# Patient Record
Sex: Male | Born: 1945 | Race: Black or African American | Hispanic: No | Marital: Single | Smoking: Former smoker
Health system: Southern US, Community
[De-identification: ages and names within clinical notes are randomized; demographics above are authoritative.]

## PROBLEM LIST (undated history)

## (undated) DIAGNOSIS — I10 Essential (primary) hypertension: Secondary | ICD-10-CM

## (undated) HISTORY — PX: BACK SURGERY: SHX140

## (undated) HISTORY — PX: APPENDECTOMY: SHX54

---

## 2020-08-29 ENCOUNTER — Other Ambulatory Visit: Payer: Self-pay

## 2020-08-29 ENCOUNTER — Observation Stay
Admission: EM | Admit: 2020-08-29 | Discharge: 2020-08-30 | Disposition: A | Discharge status: Home / Self Care | Payer: Medicare HMO | Attending: Internal Medicine | Admitting: Internal Medicine

## 2020-08-29 ENCOUNTER — Emergency Department: Payer: Medicare HMO

## 2020-08-29 ENCOUNTER — Observation Stay: Payer: Medicare HMO

## 2020-08-29 ENCOUNTER — Encounter: Payer: Self-pay | Admitting: Emergency Medicine

## 2020-08-29 DIAGNOSIS — I639 Cerebral infarction, unspecified: Secondary | ICD-10-CM | POA: Diagnosis not present

## 2020-08-29 DIAGNOSIS — Z20822 Contact with and (suspected) exposure to covid-19: Secondary | ICD-10-CM | POA: Diagnosis not present

## 2020-08-29 DIAGNOSIS — R29898 Other symptoms and signs involving the musculoskeletal system: Secondary | ICD-10-CM | POA: Insufficient documentation

## 2020-08-29 DIAGNOSIS — Z79899 Other long term (current) drug therapy: Secondary | ICD-10-CM | POA: Insufficient documentation

## 2020-08-29 DIAGNOSIS — Z87891 Personal history of nicotine dependence: Secondary | ICD-10-CM | POA: Diagnosis not present

## 2020-08-29 DIAGNOSIS — I10 Essential (primary) hypertension: Secondary | ICD-10-CM | POA: Diagnosis present

## 2020-08-29 DIAGNOSIS — R200 Anesthesia of skin: Secondary | ICD-10-CM | POA: Diagnosis present

## 2020-08-29 HISTORY — DX: Essential (primary) hypertension: I10

## 2020-08-29 LAB — COMPREHENSIVE METABOLIC PANEL
ALT: 18 U/L (ref 0–44)
AST: 22 U/L (ref 15–41)
Albumin: 4.5 g/dL (ref 3.5–5.0)
Alkaline Phosphatase: 69 U/L (ref 38–126)
Anion gap: 8 (ref 5–15)
BUN: 17 mg/dL (ref 8–23)
CO2: 24 mmol/L (ref 22–32)
Calcium: 9.1 mg/dL (ref 8.9–10.3)
Chloride: 108 mmol/L (ref 98–111)
Creatinine, Ser: 1.13 mg/dL (ref 0.61–1.24)
GFR, Estimated: >60 mL/min (ref >60)
Glucose, Bld: 115 mg/dL — ABNORMAL HIGH (ref 70–99)
Potassium: 4 mmol/L (ref 3.5–5.1)
Sodium: 140 mmol/L (ref 135–145)
Total Bilirubin: 0.8 mg/dL (ref 0.3–1.2)
Total Protein: 7.7 g/dL (ref 6.5–8.1)

## 2020-08-29 LAB — CBC
HCT: 44.1 % (ref 39.0–52.0)
Hemoglobin: 15.4 g/dL (ref 13.0–17.0)
MCH: 34.5 pg — ABNORMAL HIGH (ref 26.0–34.0)
MCHC: 34.9 g/dL (ref 30.0–36.0)
MCV: 98.7 fL (ref 80.0–100.0)
Platelets: 179 10*3/uL (ref 150–400)
RBC: 4.47 MIL/uL (ref 4.22–5.81)
RDW: 12.4 % (ref 11.5–15.5)
WBC: 6.5 10*3/uL (ref 4.0–10.5)
nRBC: 0 % (ref 0.0–0.2)

## 2020-08-29 LAB — URINE DRUG SCREEN, QUALITATIVE (ARMC ONLY)
Amphetamines, Ur Screen: NOT DETECTED
Barbiturates, Ur Screen: NOT DETECTED
Benzodiazepine, Ur Scrn: NOT DETECTED
Cannabinoid 50 Ng, Ur ~~LOC~~: NOT DETECTED
Cocaine Metabolite,Ur ~~LOC~~: NOT DETECTED
MDMA (Ecstasy)Ur Screen: NOT DETECTED
Methadone Scn, Ur: NOT DETECTED
Opiate, Ur Screen: NOT DETECTED
Phencyclidine (PCP) Ur S: NOT DETECTED
Tricyclic, Ur Screen: NOT DETECTED

## 2020-08-29 LAB — SARS CORONAVIRUS 2 (TAT 6-24 HRS): SARS Coronavirus 2: NEGATIVE

## 2020-08-29 LAB — DIFFERENTIAL
Abs Immature Granulocytes: 0.01 10*3/uL (ref 0.00–0.07)
Basophils Absolute: 0.1 10*3/uL (ref 0.0–0.1)
Basophils Relative: 1 %
Eosinophils Absolute: 0.1 10*3/uL (ref 0.0–0.5)
Eosinophils Relative: 1 %
Immature Granulocytes: 0 %
Lymphocytes Relative: 28 %
Lymphs Abs: 1.9 10*3/uL (ref 0.7–4.0)
Monocytes Absolute: 0.5 10*3/uL (ref 0.1–1.0)
Monocytes Relative: 8 %
Neutro Abs: 4 10*3/uL (ref 1.7–7.7)
Neutrophils Relative %: 62 %

## 2020-08-29 LAB — PROTIME-INR
INR: 1 (ref 0.8–1.2)
Prothrombin Time: 12.9 seconds (ref 11.4–15.2)

## 2020-08-29 LAB — APTT: aPTT: 28 seconds (ref 24–36)

## 2020-08-29 IMAGING — US US CAROTID DUPLEX BILAT
1 series · 13 of 24 positions shown · non-contrast
Comparison: Corresponding MRI from [DATE].

CLINICAL DATA: Initial evaluation for acute stroke.

EXAM:
BILATERAL CAROTID DUPLEX ULTRASOUND
TECHNIQUE: Gray scale imaging, color Doppler and duplex ultrasound were
performed of bilateral carotid and vertebral arteries in the neck.

[Series 1: us carotid bilateral · 13 of 68 slices shown]
[im 1/68]
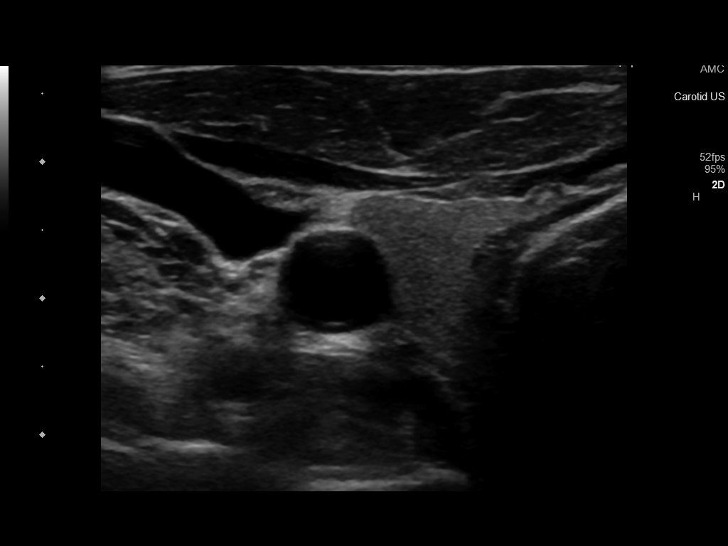
[im 6/68]
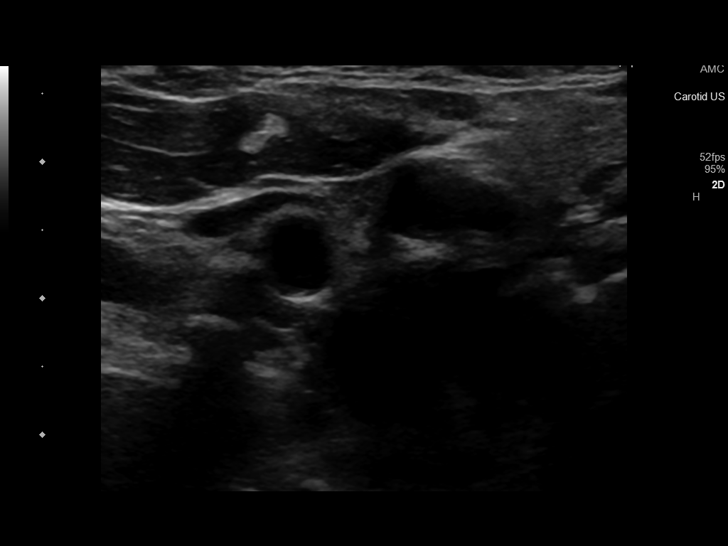
[im 12/68]
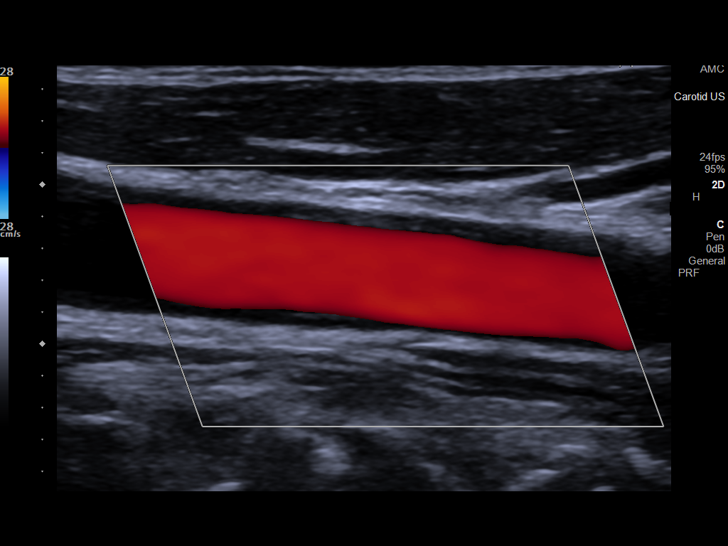
[im 18/68]
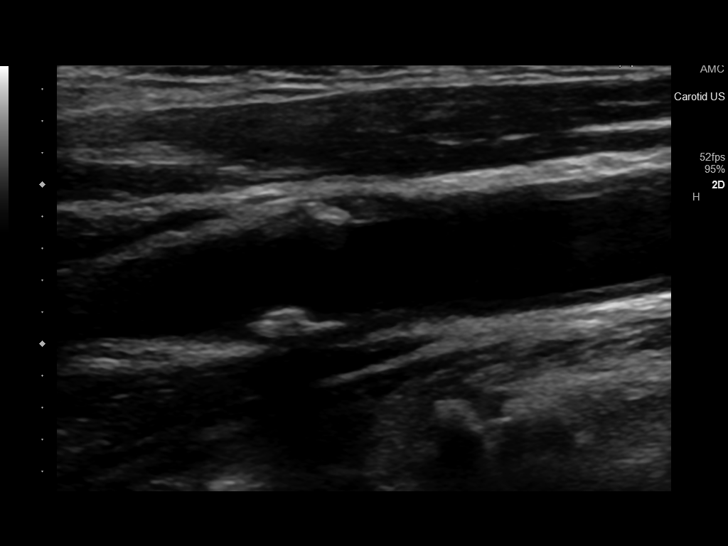
[im 24/68]
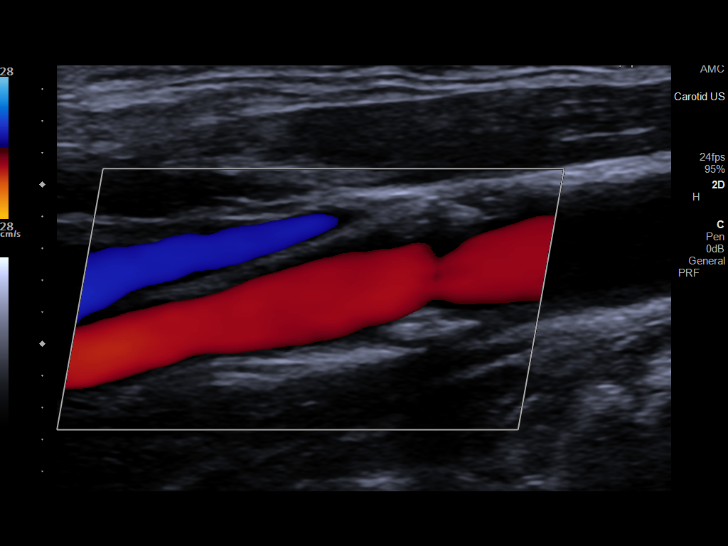
[im 30/68]
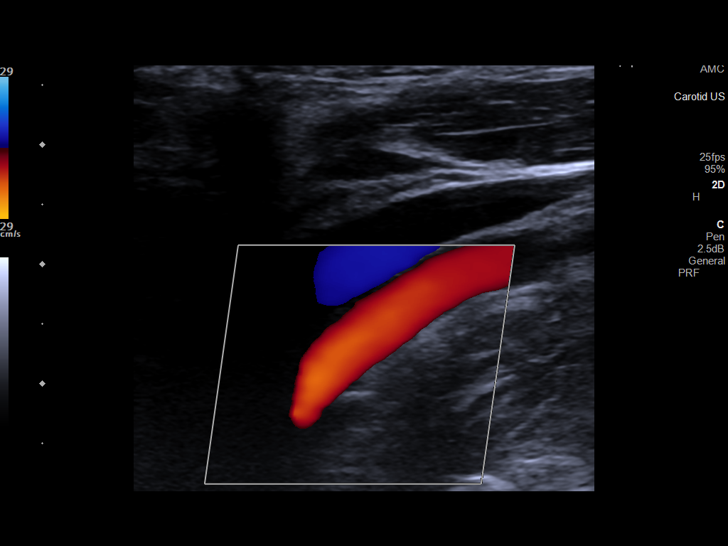
[im 35/68]
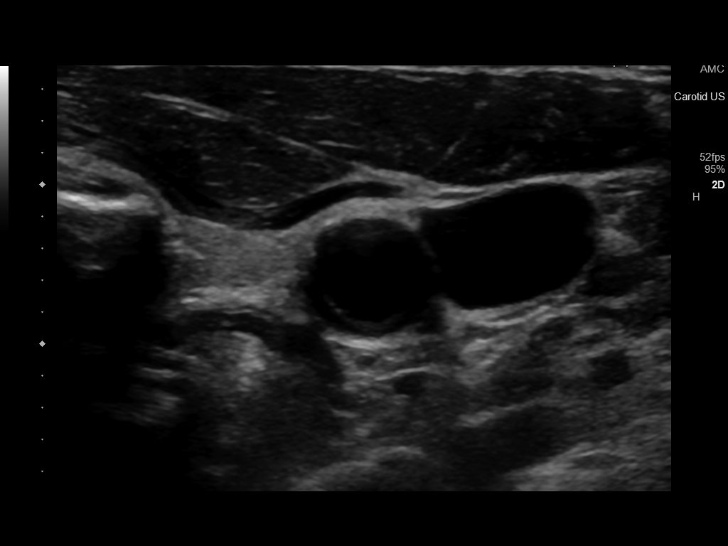
[im 38/68]
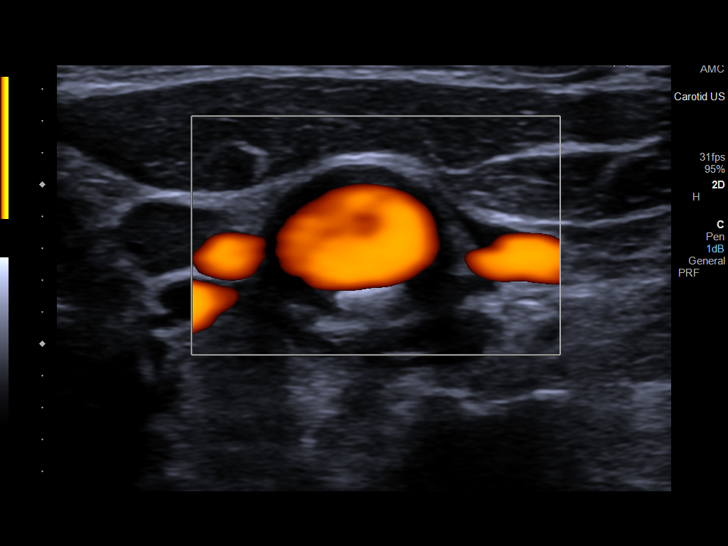
[im 44/68]
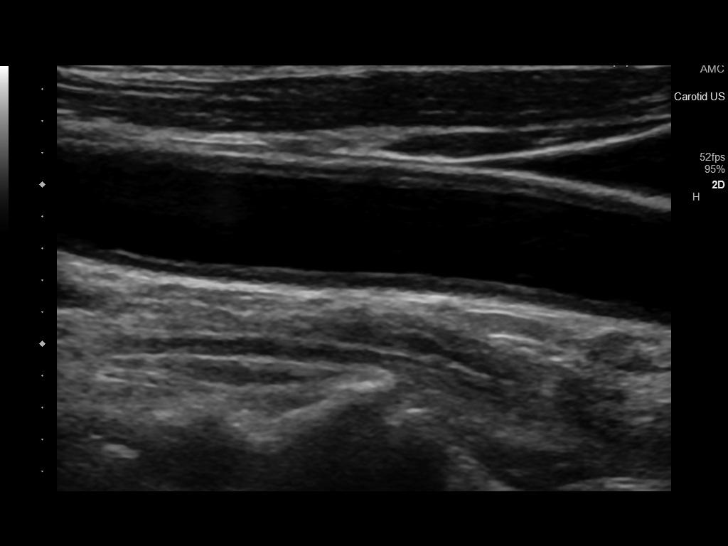
[im 50/68]
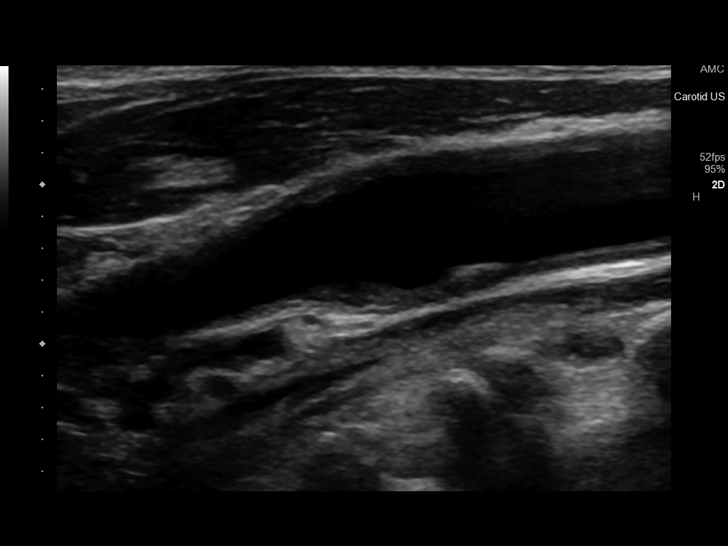
[im 56/68]
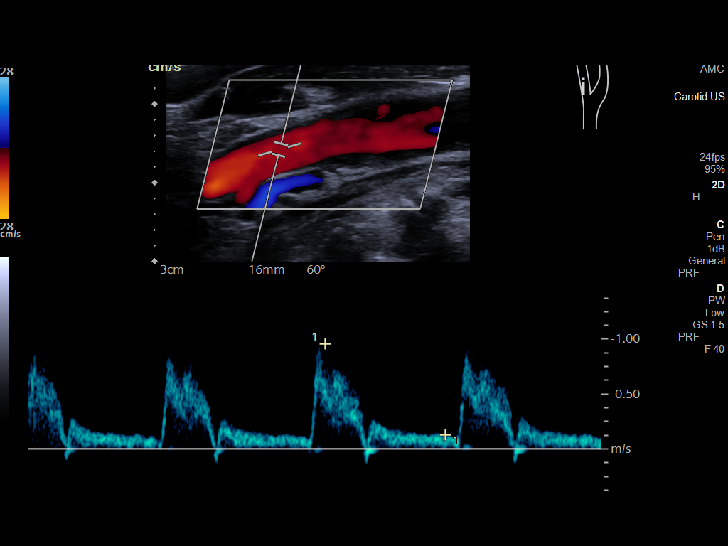
[im 62/68]
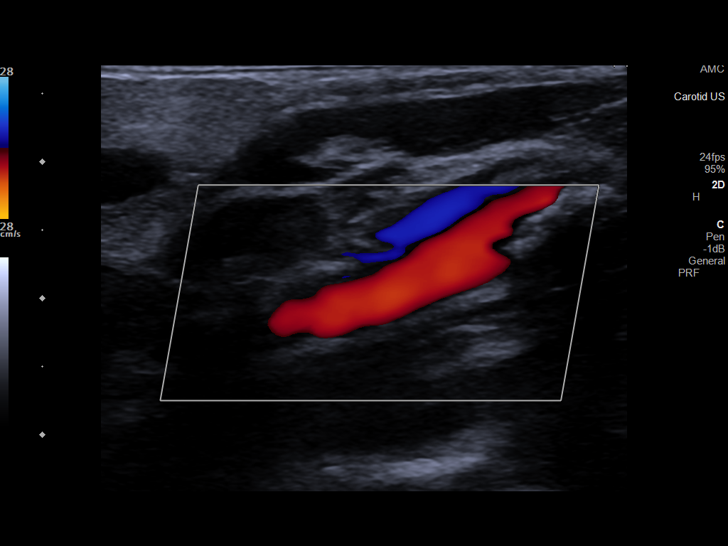
[im 68/68]
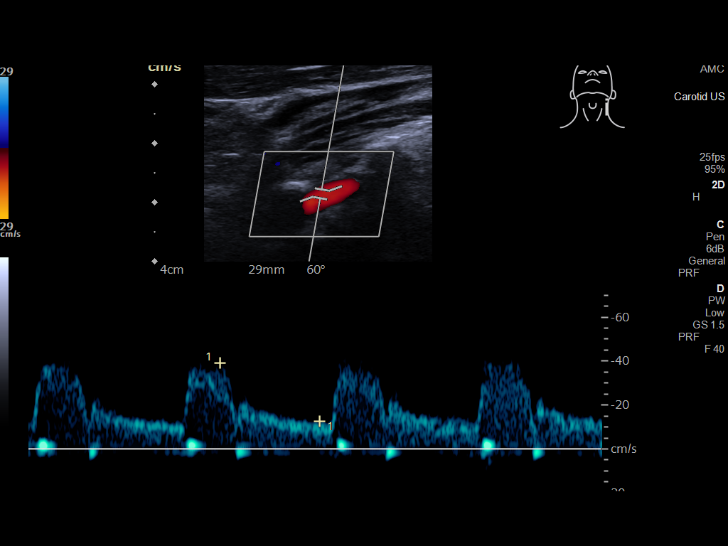

[13 of 24 positions shown; findings below may reference images not displayed]

FINDINGS: Criteria: Quantification of carotid stenosis is based on velocity
parameters that correlate the residual internal carotid diameter
with NASCET-based stenosis levels, using the diameter of the distal
internal carotid lumen as the denominator for stenosis measurement.

The following velocity measurements were obtained:

RIGHT

ICA: 102/32 cm/sec

CCA: 89/17 cm/sec

SYSTOLIC ICA/CCA RATIO:

ECA: 109 cm/sec

LEFT

ICA: 115/34 cm/sec

CCA: 94/23 cm/sec

SYSTOLIC ICA/CCA RATIO:

ECA: 96 cm/sec

RIGHT CAROTID ARTERY: Mild diffuse intimal thickening seen
throughout the visualized right CCA without significant stenosis.
Echogenic plaque with mild irregularity seen about the right carotid
bulb. No elevation in peak systolic velocity to suggest
hemodynamically significant stenosis. Right ICA widely patent
distally.

RIGHT VERTEBRAL ARTERY:  Antegrade.

LEFT CAROTID ARTERY: Mild diffuse intimal thickening within the left
CCA without stenosis. Irregular echogenic atheromatous plaque seen
about the left carotid bulb with extension into the proximal left
ICA, slightly more severe as compared to the contralateral right
side. No elevation in peak systolic velocity to suggest
hemodynamically significant stenosis. Left ICA widely patent
distally.

LEFT VERTEBRAL ARTERY:  Antegrade.
IMPRESSION: 1. Irregular echogenic plaque about the carotid bulb/proximal ICAs
bilaterally, left greater than right, but without hemodynamically
significant stenosis. Estimated stenosis 0-49% by ultrasound
criteria.
2. Patent antegrade flow within both vertebral arteries within the
neck.

## 2020-08-29 IMAGING — CT CT HEAD W/O CM
4 series · 16 of 47 positions shown, 18 images · non-contrast
Comparison: None

CLINICAL DATA: Awoke this morning with difficulty controlling RIGHT
arm, RIGHT side numbness, RIGHT side of face feels numb are funny,
history hypertension

EXAM:
CT HEAD WITHOUT CONTRAST
TECHNIQUE: Contiguous axial images were obtained from the base of the skull
through the vertex without intravenous contrast. Sagittal and
coronal MPR images reconstructed from axial data set.

[Series 2: head bone · axial · 0.39mm/px · z∈[+1018,+1046]mm · 3 of 73 slices shown]
[im 8/73  bone]
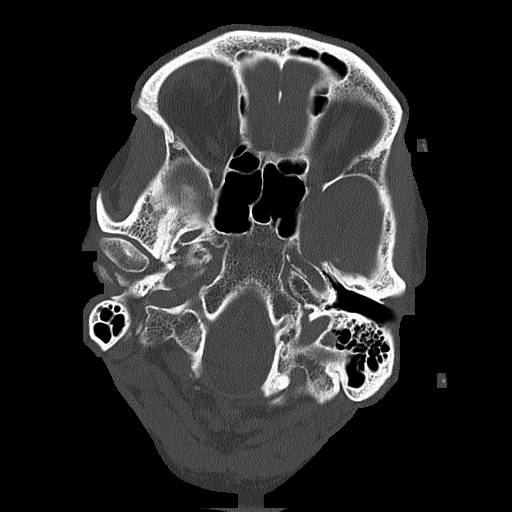
[im 15/73  bone]
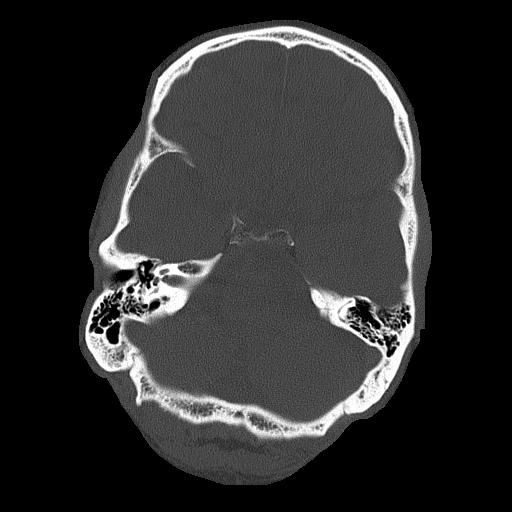
[im 22/73  bone]
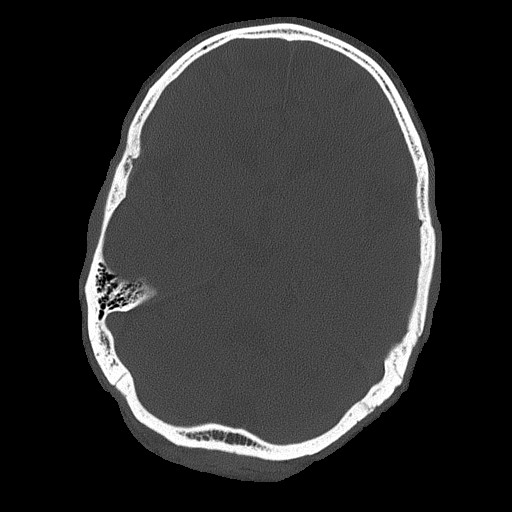

[Series 3: head wo · axial · 0.39mm/px · z∈[+1019,+1124]mm · 7 of 29 slices shown, 9 images]
[im 4/29  brain]
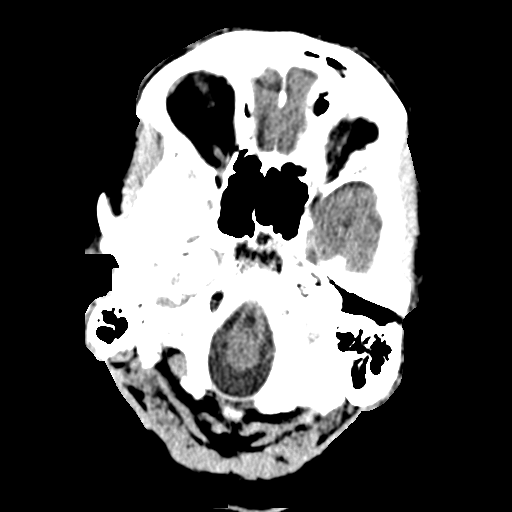
[im 4/29  bone]
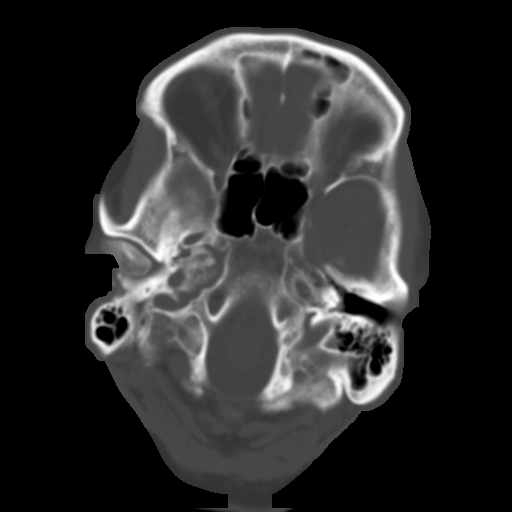
[im 8/29  brain]
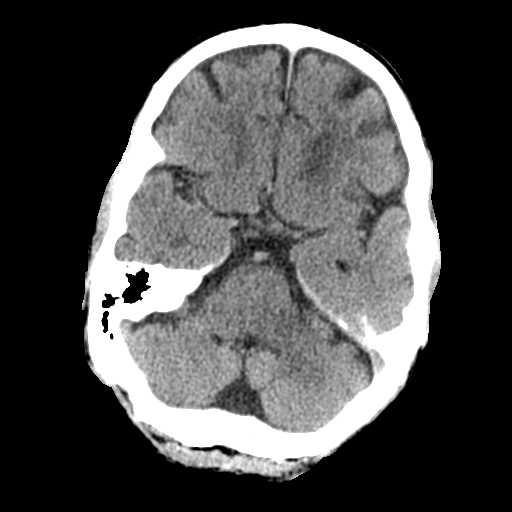
[im 11/29  brain]
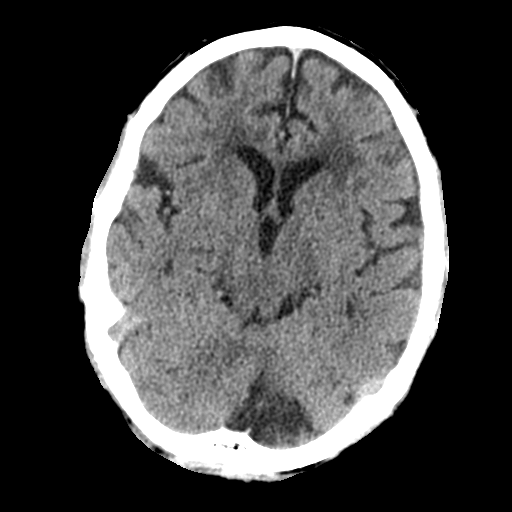
[im 15/29  brain]
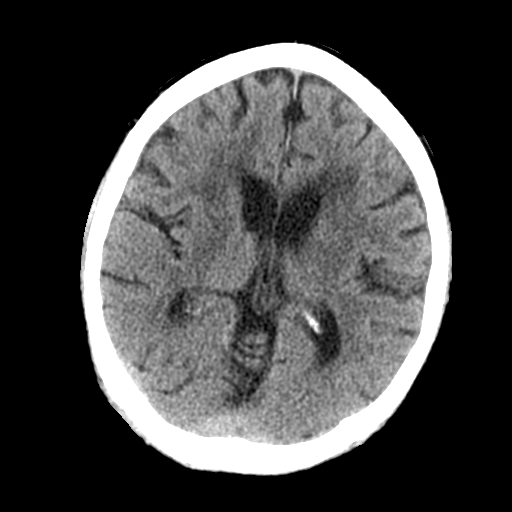
[im 18/29  brain]
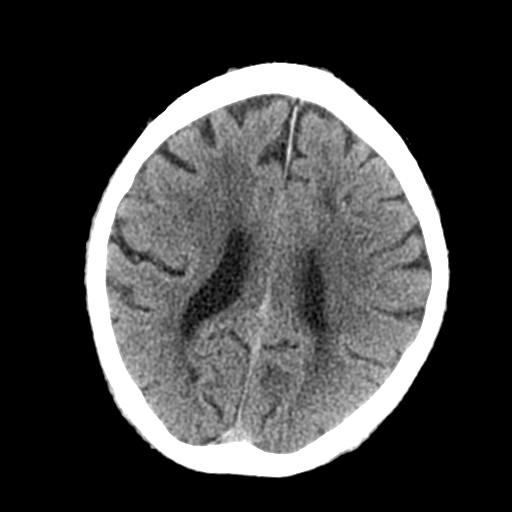
[im 18/29  bone]
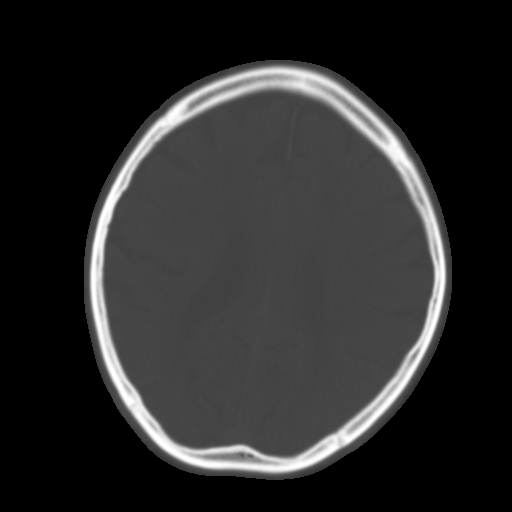
[im 22/29  brain]
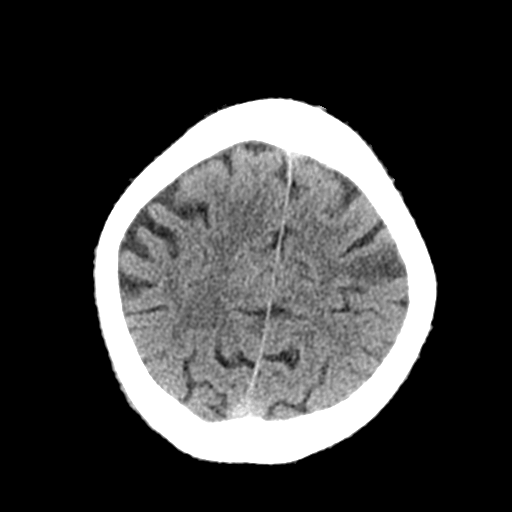
[im 25/29  brain]
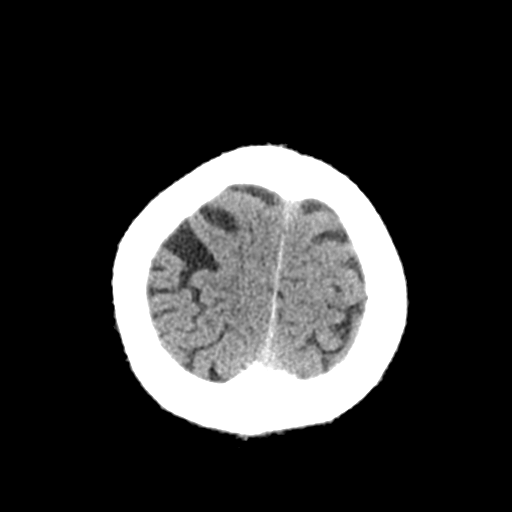

[Series 4: coronal soft tissue · coronal · 0.30mm/px · 3 of 65 slices shown]
[im 22/65  brain]
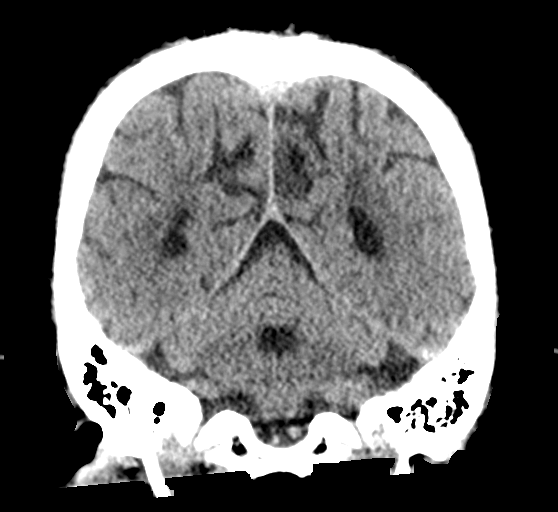
[im 29/65  brain]
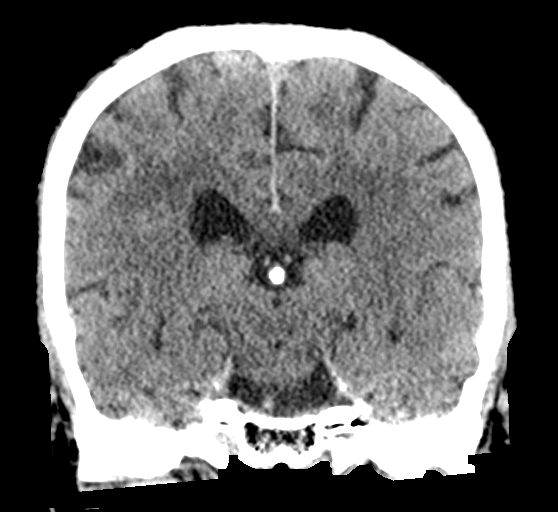
[im 36/65  brain]
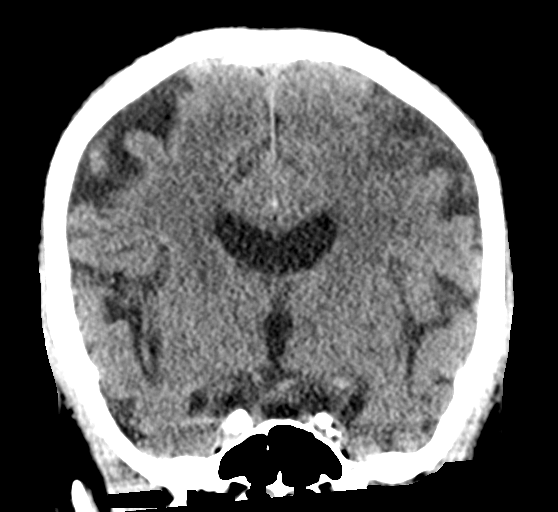

[Series 5: sagittal soft tissue · sagittal · 0.30mm/px · 3 of 56 slices shown]
[im 19/56  brain]
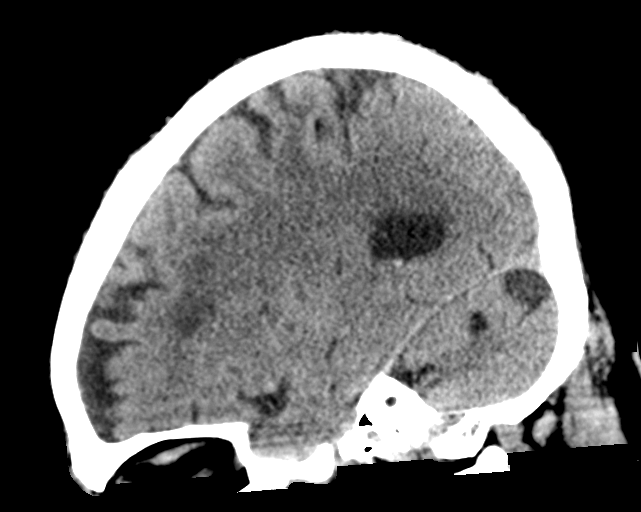
[im 28/56  brain]
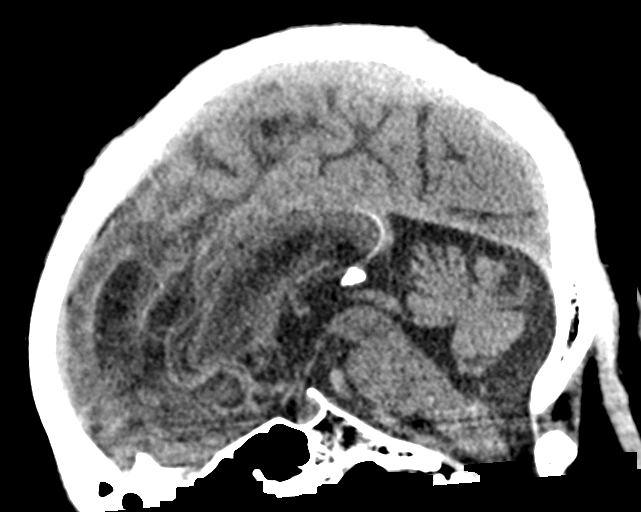
[im 37/56  brain]
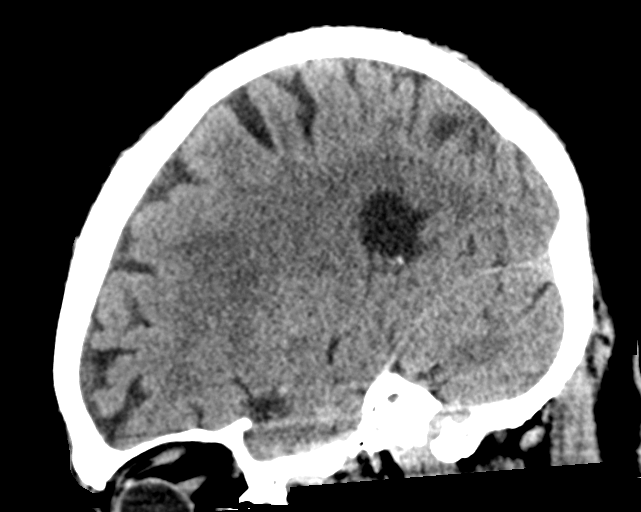

[16 of 47 positions shown; findings below may reference images not displayed]

FINDINGS: Brain: Generalized atrophy. Normal ventricular morphology. No
midline shift or mass effect. Small vessel chronic ischemic changes
of deep cerebral white matter. Old BILATERAL cerebellar infarcts,
larger on LEFT. Small age-indeterminate LEFT frontal lobe infarct.
No intracranial hemorrhage, mass lesion, or additional infarct
identified. No extra-axial fluid collections.

Vascular: No hyperdense vessels. Atherosclerotic calcification of
internal carotid arteries at skull base

Skull: Intact

Sinuses/Orbits: Clear

Other: N/A
IMPRESSION: Atrophy with small vessel chronic ischemic changes of deep cerebral
white matter.

Old BILATERAL cerebellar infarcts, larger on LEFT.

Small age-indeterminate LEFT frontal lobe infarct.

No additional intracranial abnormalities.

## 2020-08-29 IMAGING — MR MR HEAD W/O CM
11 series · 38 of 48 positions shown · non-contrast
Comparison: Prior CT from [DATE]

CLINICAL DATA: Initial evaluation for neuro deficit, stroke
suspected.

EXAM:
MRI HEAD WITHOUT CONTRAST
MRA HEAD WITHOUT CONTRAST
TECHNIQUE: Multiplanar, multiecho pulse sequences of the brain and surrounding
structures were obtained without intravenous contrast. Angiographic
images of the head were obtained using MRA technique without
contrast.

[Series 5: ax dwi_tracew · axial · 3.0mm · 0.65mm/px · z∈[-141,+2]mm · 4 of 48 slices shown]
[im 1/48]
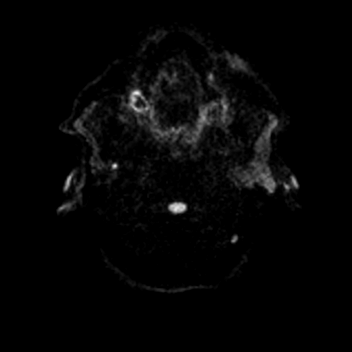
[im 16/48]
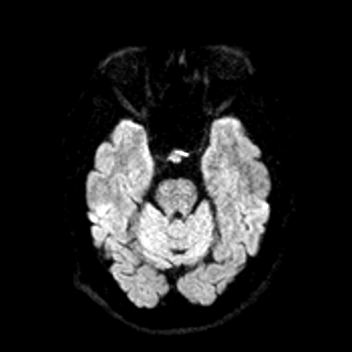
[im 32/48]
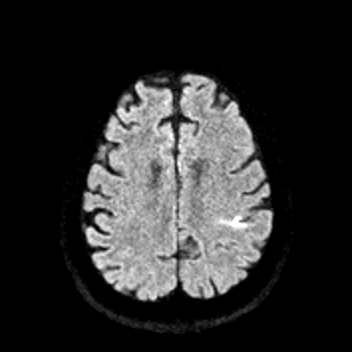
[im 48/48]
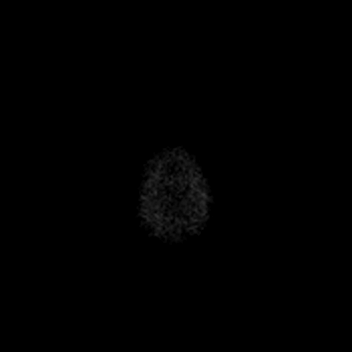

[Series 6: ax dwi_adc · axial · 3.0mm · 0.65mm/px · z∈[-141,+2]mm · 4 of 48 slices shown]
[im 1/48]
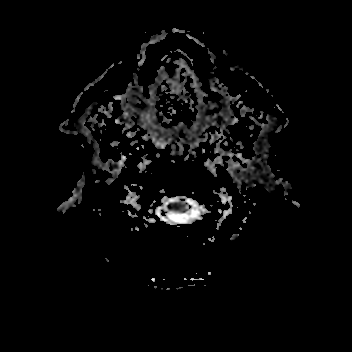
[im 16/48]
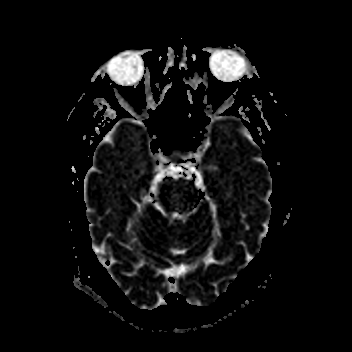
[im 32/48]
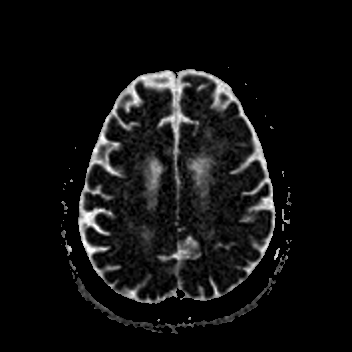
[im 48/48]
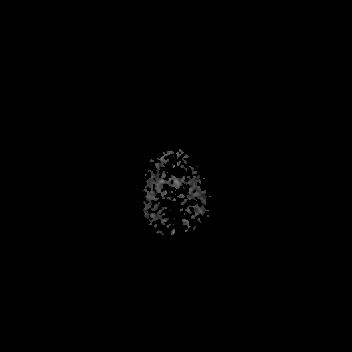

[Series 7: cor dwi_tracew · coronal · 5.0mm · 0.65mm/px · 3 of 40 slices shown]
[im 1/40]
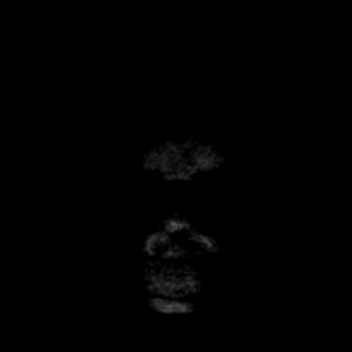
[im 20/40]
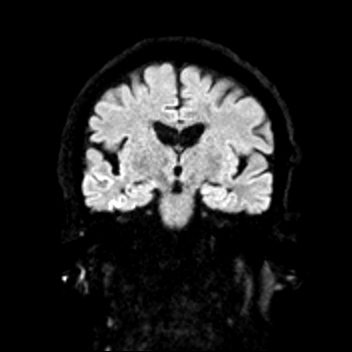
[im 40/40]
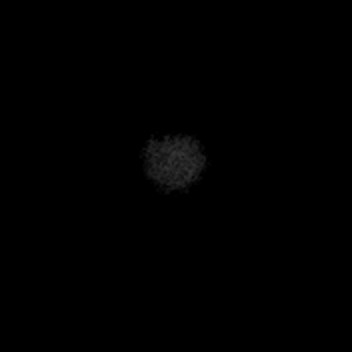

[Series 8: cor dwi_adc · coronal · 5.0mm · 0.65mm/px · 3 of 40 slices shown]
[im 1/40]
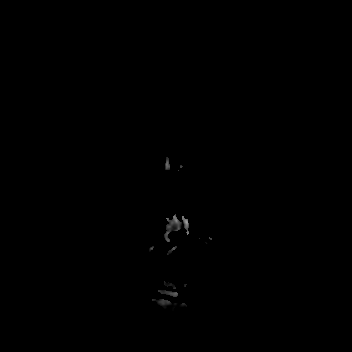
[im 20/40]
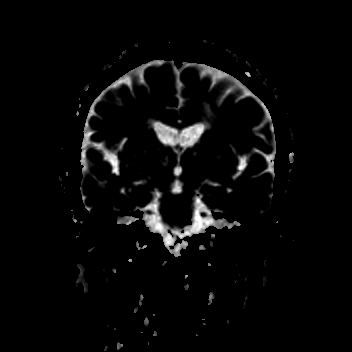
[im 40/40]
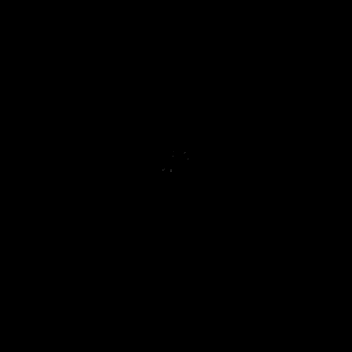

[Series 14: T1 · sagittal · 5.0mm · 0.62mm/px · 2 of 25 slices shown (1 of 2)]
[im 1/25]
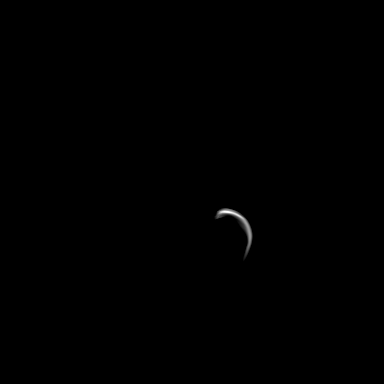
[im 25/25]
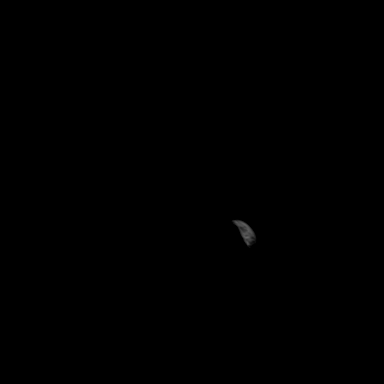

[Series 15: T2 · axial · 5.0mm · 0.53mm/px · z∈[-139,+4]mm · 2 of 27 slices shown (1 of 2)]
[im 1/27]
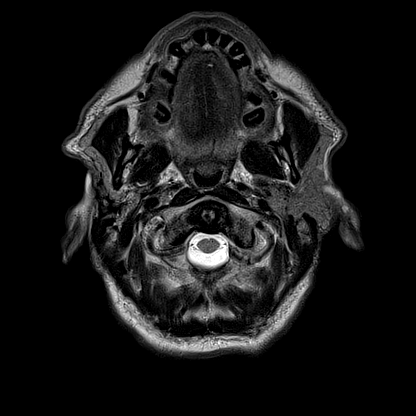
[im 27/27]
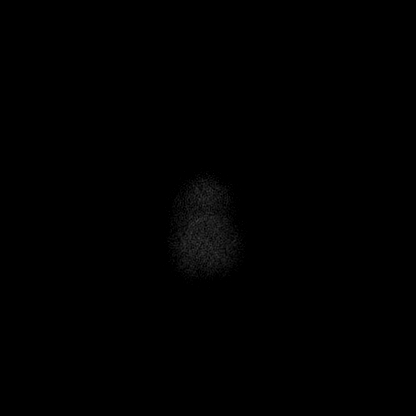

[Series 17: pha_images · axial · 3.0mm · 0.90mm/px · z∈[-151,+12]mm · 5 of 57 slices shown]
[im 1/57]
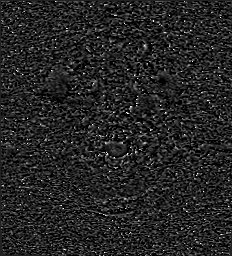
[im 15/57]
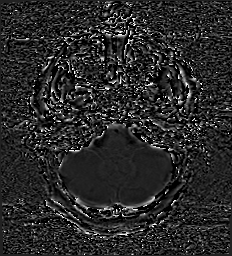
[im 29/57]
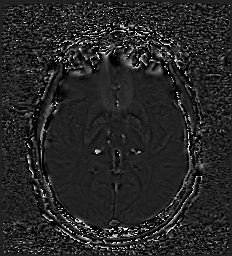
[im 43/57]
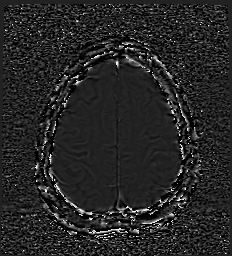
[im 57/57]
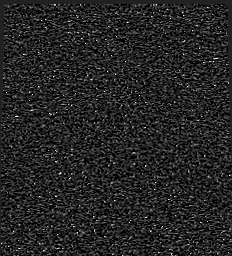

[Series 18: swi_images · axial · 3.0mm · 0.90mm/px · 1 of 60 slices shown]
[im 1/60]
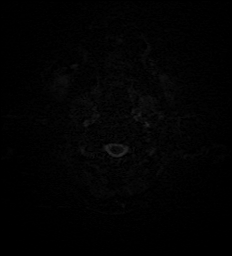

[Series 20: FLAIR · axial · 3.0mm · 0.53mm/px · z∈[-142,+7]mm · 4 of 55 slices shown]
[im 1/55]
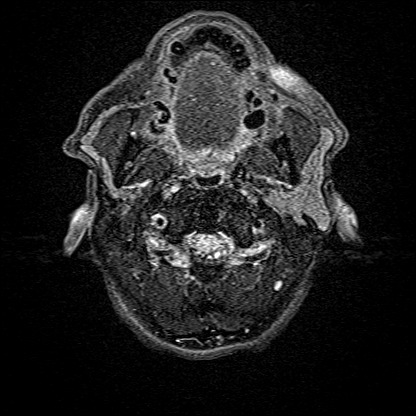
[im 19/55]
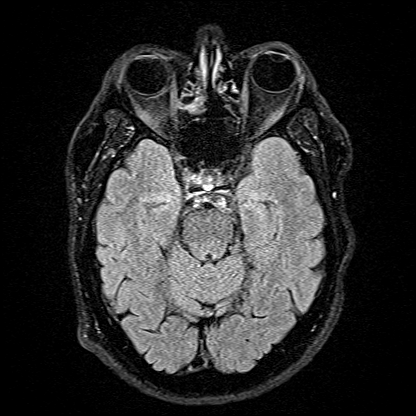
[im 37/55]
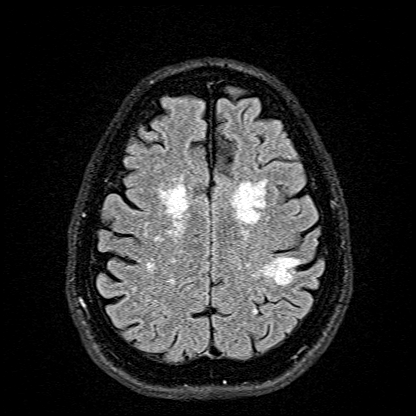
[im 55/55]
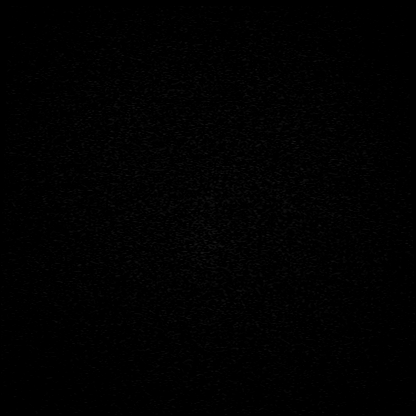

[Series 21: T1 · axial · 1.0mm · 0.98mm/px · z∈[-154,+7]mm · 8 of 176 slices shown (2 of 2)]
[im 1/176]
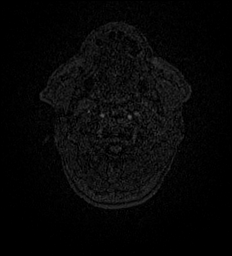
[im 27/176]
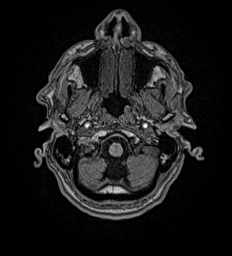
[im 54/176]
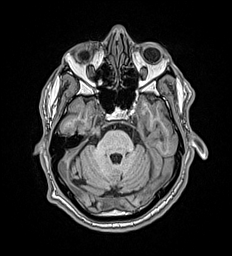
[im 81/176]
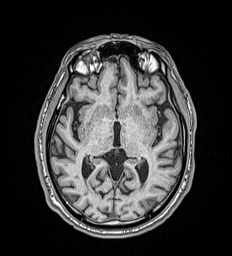
[im 95/176]
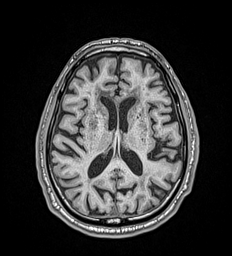
[im 122/176]
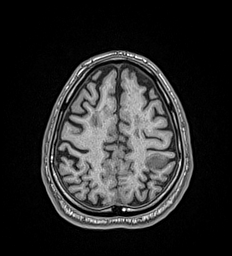
[im 149/176]
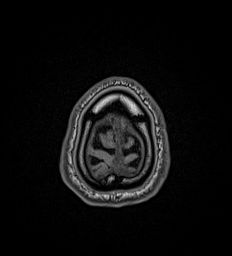
[im 176/176]
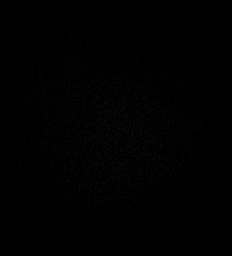

[Series 22: T2 · coronal · 5.0mm · 0.57mm/px · 2 of 30 slices shown (2 of 2)]
[im 1/30]
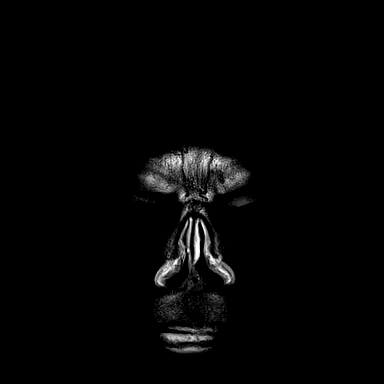
[im 30/30]
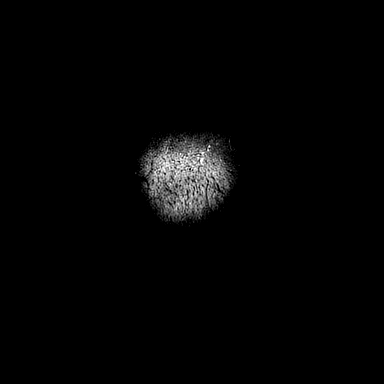

[38 of 48 positions shown; findings below may reference images not displayed]

FINDINGS: MRI HEAD FINDINGS

Brain: Generalized age-related cerebral atrophy. Patchy and
confluent T2/FLAIR hyperintensity seen involving the periventricular
and deep white matter both cerebral hemispheres as well as the pons,
most consistent with chronic small vessel ischemic disease, moderate
in nature. Multiple scattered superimposed remote lacunar infarcts
present about the bilateral basal ganglia and thalami. Multiple
remote bilateral cerebellar infarcts noted, left worse than right.

A approximate 2.4 cm area of diffusion abnormality involving the
posterior left parietal lobe, postcentral gyrus (series 5, image
36). Minimally decreased ADC signal intensity with associated
T2/FLAIR signal abnormality, consistent with an early subacute
ischemic infarct, posterior left MCA distribution. Associated mild
petechial hemorrhage without frank hemorrhagic transformation or
significant mass effect.

No other evidence for acute or subacute ischemia. Gray-white matter
differentiation otherwise maintained. No other acute intracranial
hemorrhage. Multiple scattered chronic micro hemorrhages noted
clustered about the thalami, with a few additional scattered chronic
bleeds within the bilateral cerebral hemispheres, most like related
to chronic poorly controlled hypertension.

No mass lesion, midline shift or mass effect. No hydrocephalus or
extra-axial fluid collection. Pituitary gland suprasellar region
normal. Midline structures intact.

Vascular: Major intracranial vascular flow voids are maintained.

Skull and upper cervical spine: Craniocervical junction within
normal limits. Bone marrow signal intensity normal. No scalp soft
tissue abnormality.

Sinuses/Orbits: Globes and orbital soft tissues within normal
limits. Scattered mucosal thickening noted throughout the maxillary
and ethmoidal sinuses. Small left maxillary sinus retention cyst
noted as well. Trace left mastoid effusion noted, of doubtful
significance. Inner ear structures grossly normal.

Other: None.

MRA HEAD FINDINGS

ANTERIOR CIRCULATION:

Visualized distal cervical segments of the internal carotid arteries
are widely patent with antegrade flow. Petrous segments patent
bilaterally. Diffuse atheromatous irregularity within the
cavernous/supraclinoid ICAs without hemodynamically significant
stenosis. Tiny 2 mm focal outpouching arising from the cavernous
left ICA could reflect a small aneurysm versus vascular
infundibulum(series 9, image 92). This projects laterally. A1
segments patent bilaterally. Normal anterior communicating artery
complex. Anterior cerebral arteries patent to their distal aspects
without stenosis. No M1 stenosis or occlusion. Normal MCA
bifurcations. Distal MCA branches well perfused and symmetric.

POSTERIOR CIRCULATION:

Both V4 segments patent to the vertebrobasilar junction without
stenosis. Both PICA origins patent and normal. Basilar patent to its
distal aspect without stenosis. Superior cerebral arteries patent
bilaterally. Both PCAs primarily supplied via the basilar well
perfused or distal aspects.
IMPRESSION: MRI HEAD IMPRESSION:

1. Early subacute ischemic infarct involving the posterior left
parietal lobe, with involvement of the postcentral gyrus. Associated
mild petechial hemorrhage without hemorrhagic transformation or
significant mass effect.
2. Underlying age-related cerebral atrophy with moderate chronic
microvascular ischemic disease, with multiple remote infarcts about
the basal ganglia, thalami, and cerebellum.
3. Multiple chronic micro hemorrhages clustered about the thalami,
most consistent with chronic poorly controlled hypertension.

MRA HEAD IMPRESSION:

1. Negative intracranial MRA for large vessel occlusion. No
hemodynamically significant or correctable stenosis.
2. Tiny 2 mm focal outpouching arising from the cavernous left ICA,
which could reflect a small aneurysm versus vascular infundibulum.
Attention at follow-up recommended.

## 2020-08-29 IMAGING — MR MR MRA HEAD W/O CM
2 series · 17 of 48 positions shown · non-contrast
Comparison: Prior CT from [DATE]

CLINICAL DATA: Initial evaluation for neuro deficit, stroke
suspected.

EXAM:
MRI HEAD WITHOUT CONTRAST
MRA HEAD WITHOUT CONTRAST
TECHNIQUE: Multiplanar, multiecho pulse sequences of the brain and surrounding
structures were obtained without intravenous contrast. Angiographic
images of the head were obtained using MRA technique without
contrast.

[Series 9: TOF · axial · 0.5mm · 0.41mm/px · z∈[-136,-46]mm · 16 of 205 slices shown]
[im 1/205]
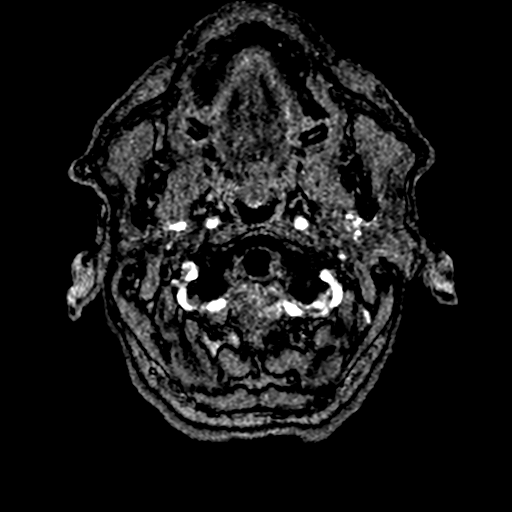
[im 5/205]
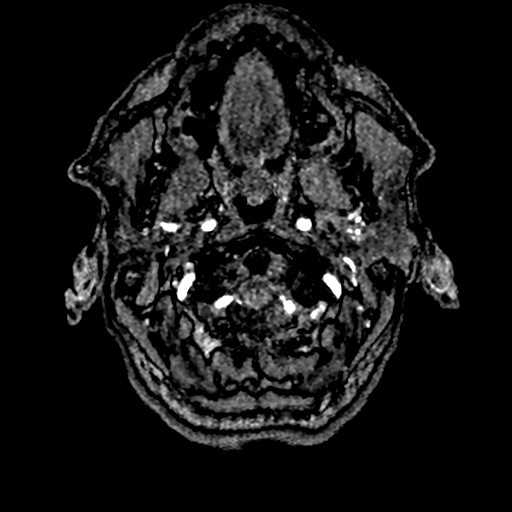
[im 9/205]
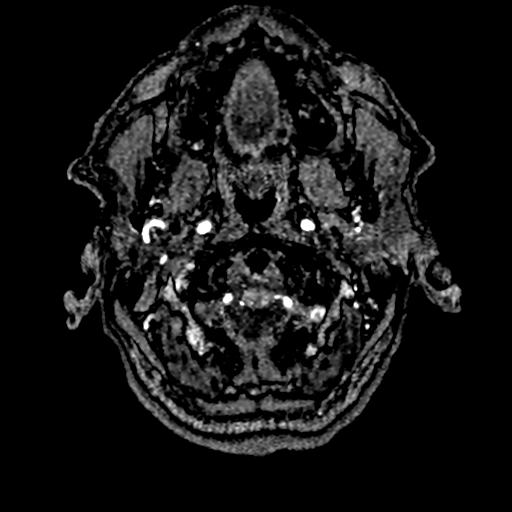
[im 14/205]
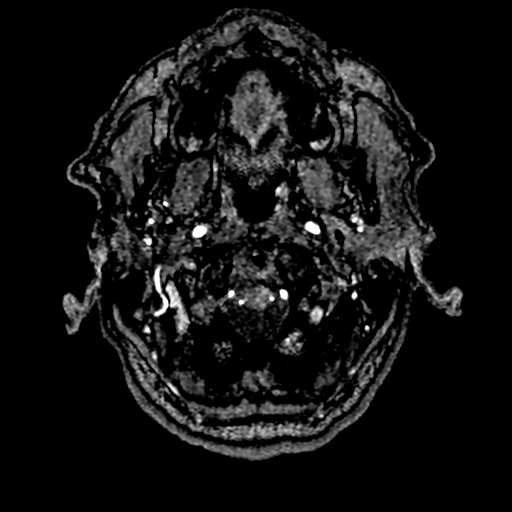
[im 18/205]
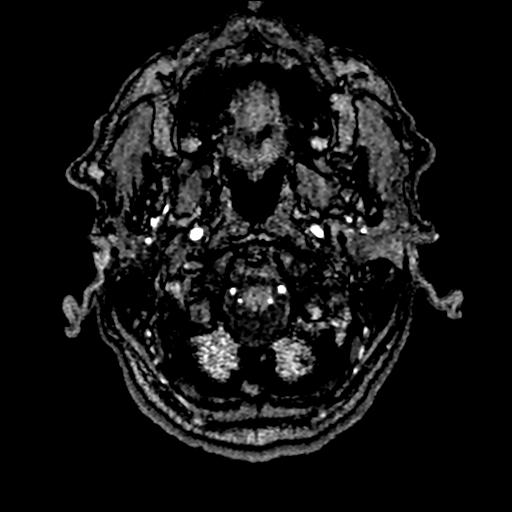
[im 23/205]
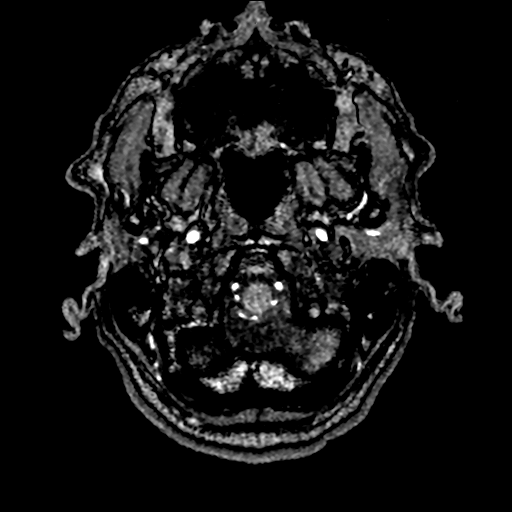
[im 32/205]
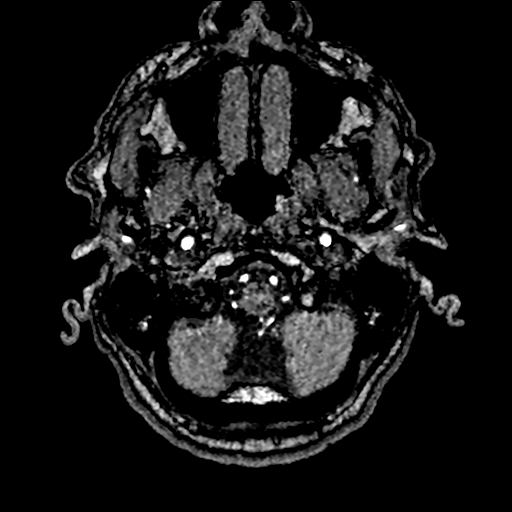
[im 36/205]
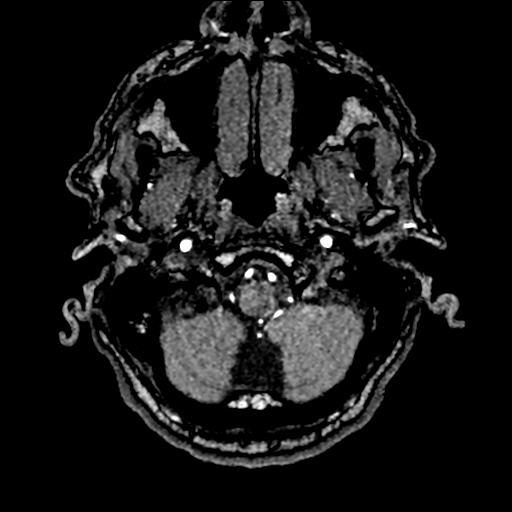
[im 63/205]
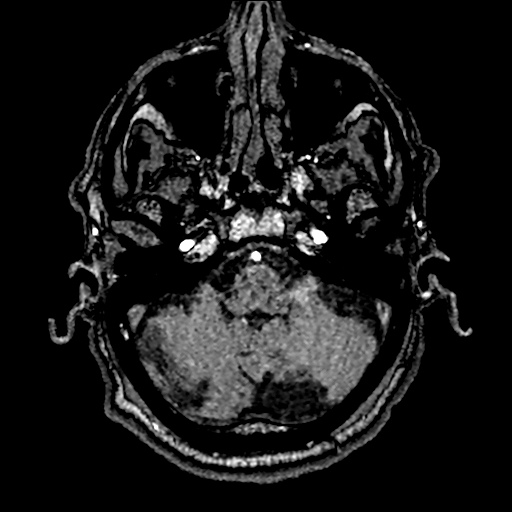
[im 89/205]
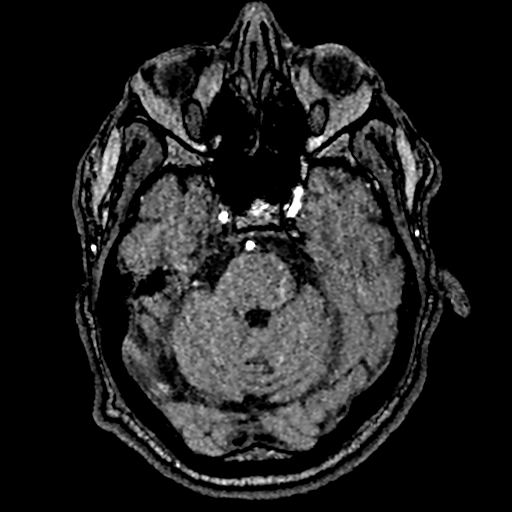
[im 103/205]
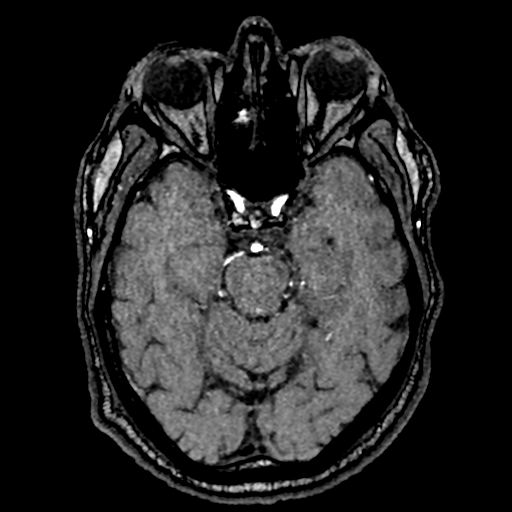
[im 116/205]
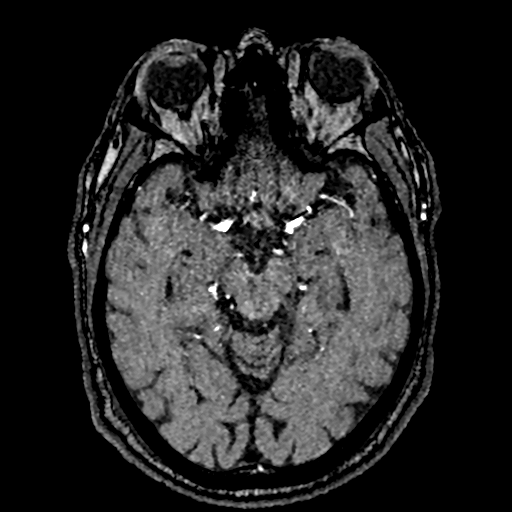
[im 142/205]
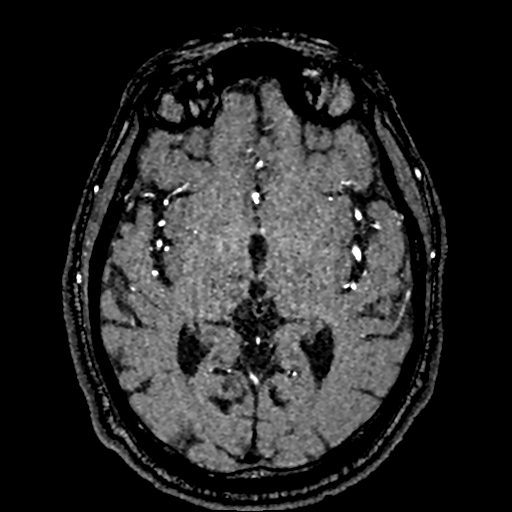
[im 169/205]
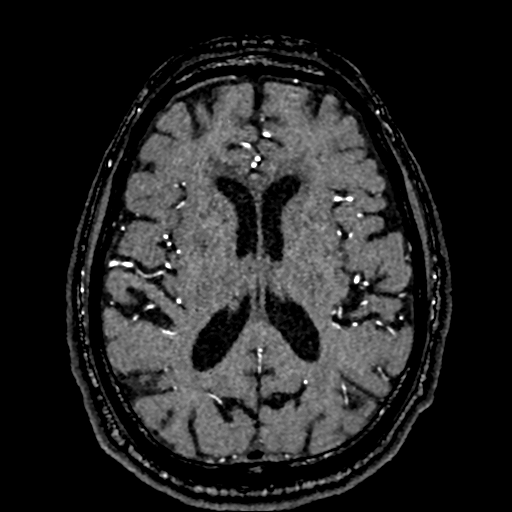
[im 173/205]
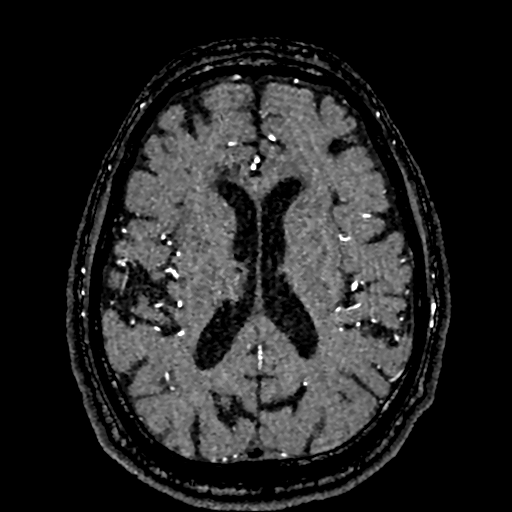
[im 196/205]
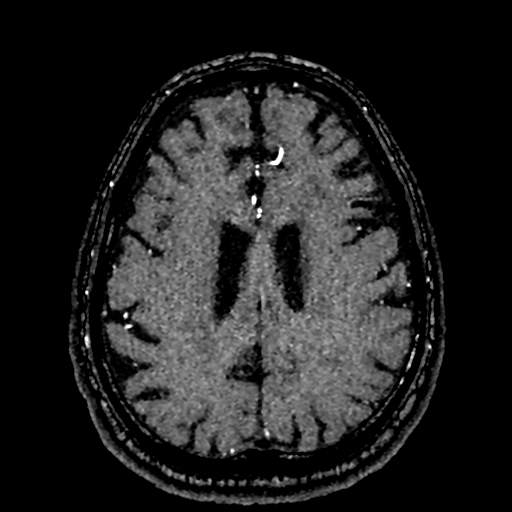

[Series 1056: mip · sagittal · 0.4mm · 0.41mm/px · 1 of 3 slices shown]
[im 1/3]
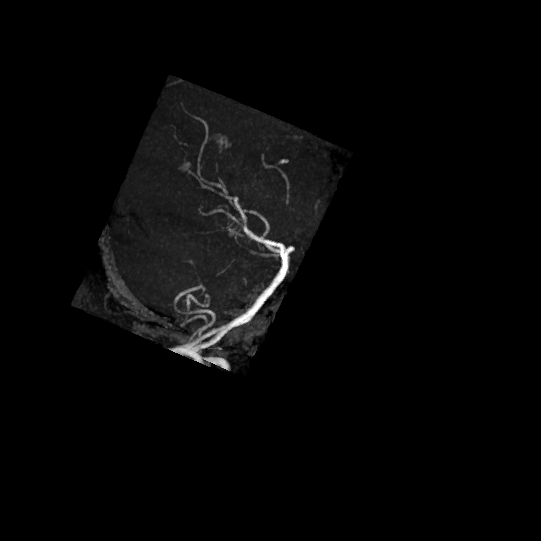

[17 of 48 positions shown; findings below may reference images not displayed]

FINDINGS: MRI HEAD FINDINGS

Brain: Generalized age-related cerebral atrophy. Patchy and
confluent T2/FLAIR hyperintensity seen involving the periventricular
and deep white matter both cerebral hemispheres as well as the pons,
most consistent with chronic small vessel ischemic disease, moderate
in nature. Multiple scattered superimposed remote lacunar infarcts
present about the bilateral basal ganglia and thalami. Multiple
remote bilateral cerebellar infarcts noted, left worse than right.

A approximate 2.4 cm area of diffusion abnormality involving the
posterior left parietal lobe, postcentral gyrus (series 5, image
36). Minimally decreased ADC signal intensity with associated
T2/FLAIR signal abnormality, consistent with an early subacute
ischemic infarct, posterior left MCA distribution. Associated mild
petechial hemorrhage without frank hemorrhagic transformation or
significant mass effect.

No other evidence for acute or subacute ischemia. Gray-white matter
differentiation otherwise maintained. No other acute intracranial
hemorrhage. Multiple scattered chronic micro hemorrhages noted
clustered about the thalami, with a few additional scattered chronic
bleeds within the bilateral cerebral hemispheres, most like related
to chronic poorly controlled hypertension.

No mass lesion, midline shift or mass effect. No hydrocephalus or
extra-axial fluid collection. Pituitary gland suprasellar region
normal. Midline structures intact.

Vascular: Major intracranial vascular flow voids are maintained.

Skull and upper cervical spine: Craniocervical junction within
normal limits. Bone marrow signal intensity normal. No scalp soft
tissue abnormality.

Sinuses/Orbits: Globes and orbital soft tissues within normal
limits. Scattered mucosal thickening noted throughout the maxillary
and ethmoidal sinuses. Small left maxillary sinus retention cyst
noted as well. Trace left mastoid effusion noted, of doubtful
significance. Inner ear structures grossly normal.

Other: None.

MRA HEAD FINDINGS

ANTERIOR CIRCULATION:

Visualized distal cervical segments of the internal carotid arteries
are widely patent with antegrade flow. Petrous segments patent
bilaterally. Diffuse atheromatous irregularity within the
cavernous/supraclinoid ICAs without hemodynamically significant
stenosis. Tiny 2 mm focal outpouching arising from the cavernous
left ICA could reflect a small aneurysm versus vascular
infundibulum(series 9, image 92). This projects laterally. A1
segments patent bilaterally. Normal anterior communicating artery
complex. Anterior cerebral arteries patent to their distal aspects
without stenosis. No M1 stenosis or occlusion. Normal MCA
bifurcations. Distal MCA branches well perfused and symmetric.

POSTERIOR CIRCULATION:

Both V4 segments patent to the vertebrobasilar junction without
stenosis. Both PICA origins patent and normal. Basilar patent to its
distal aspect without stenosis. Superior cerebral arteries patent
bilaterally. Both PCAs primarily supplied via the basilar well
perfused or distal aspects.
IMPRESSION: MRI HEAD IMPRESSION:

1. Early subacute ischemic infarct involving the posterior left
parietal lobe, with involvement of the postcentral gyrus. Associated
mild petechial hemorrhage without hemorrhagic transformation or
significant mass effect.
2. Underlying age-related cerebral atrophy with moderate chronic
microvascular ischemic disease, with multiple remote infarcts about
the basal ganglia, thalami, and cerebellum.
3. Multiple chronic micro hemorrhages clustered about the thalami,
most consistent with chronic poorly controlled hypertension.

MRA HEAD IMPRESSION:

1. Negative intracranial MRA for large vessel occlusion. No
hemodynamically significant or correctable stenosis.
2. Tiny 2 mm focal outpouching arising from the cavernous left ICA,
which could reflect a small aneurysm versus vascular infundibulum.
Attention at follow-up recommended.

## 2020-08-29 MED ORDER — ATORVASTATIN CALCIUM 20 MG PO TABS
40.0000 mg | ORAL_TABLET | Freq: Every day | ORAL | Status: DC
  Administered 2020-08-29 – 2020-08-30 (×2): 40 mg via ORAL
  Filled 2020-08-29 (×2): qty 2

## 2020-08-29 MED ORDER — STROKE: EARLY STAGES OF RECOVERY BOOK: Freq: Once | Status: AC

## 2020-08-29 MED ORDER — ASPIRIN 300 MG RE SUPP
300.0000 mg | Freq: Every day | RECTAL | Status: DC
  Filled 2020-08-29 (×2): qty 1

## 2020-08-29 MED ORDER — HYDRALAZINE HCL 20 MG/ML IJ SOLN: 5.0000 mg | INTRAMUSCULAR | Status: DC | PRN

## 2020-08-29 MED ORDER — ONDANSETRON HCL 4 MG/2ML IJ SOLN: 4.0000 mg | Freq: Three times a day (TID) | INTRAMUSCULAR | Status: DC | PRN

## 2020-08-29 MED ORDER — ASPIRIN 325 MG PO TABS
325.0000 mg | ORAL_TABLET | Freq: Every day | ORAL | Status: DC
  Administered 2020-08-29 – 2020-08-30 (×2): 325 mg via ORAL
  Filled 2020-08-29 (×2): qty 1

## 2020-08-29 MED ORDER — ACETAMINOPHEN 325 MG PO TABS: 650.0000 mg | ORAL_TABLET | ORAL | Status: DC | PRN

## 2020-08-29 MED ORDER — ENOXAPARIN SODIUM 40 MG/0.4ML ~~LOC~~ SOLN
40.0000 mg | SUBCUTANEOUS | Status: DC
  Administered 2020-08-29: 40 mg via SUBCUTANEOUS
  Filled 2020-08-29: qty 0.4

## 2020-08-29 MED ORDER — ACETAMINOPHEN 650 MG RE SUPP: 650.0000 mg | RECTAL | Status: DC | PRN

## 2020-08-29 MED ORDER — ACETAMINOPHEN 160 MG/5ML PO SOLN
650.0000 mg | ORAL | Status: DC | PRN
  Filled 2020-08-29: qty 20.3

## 2020-08-29 MED ORDER — SENNOSIDES-DOCUSATE SODIUM 8.6-50 MG PO TABS: 1.0000 | ORAL_TABLET | Freq: Every evening | ORAL | Status: DC | PRN

## 2020-08-29 MED ORDER — SODIUM CHLORIDE 0.9 % IV SOLN: INTRAVENOUS | Status: DC

## 2020-08-29 NOTE — ED Notes (Signed)
Pt via POV from home. Pt c/o R sided arm numbness that was 6 hours ago. Denies any weakness. Pt is A&Ox4 and NAD.

## 2020-08-29 NOTE — Consult Note (Signed)
NEUROLOGY CONSULTATION NOTE   Date of service: August 29, 2020 Patient Name: Rodney James MRN:  [id number] DOB:  14-Oct-1945 Reason for consult: "Stroke" _ _ _   _ __   _ __ _ _  __ __   _ __   __ _  History of Present Illness  Saw [name] is a 75 y.o. male with PMH significant for HTN who presents with acute onset R hand weakness and numbness. He went to bed at 1900 on 08/28/20. Woke up early morning and noted that his R hand was difficult to control. He was strong just could not get it to do what he wanted. He also noted that his aim was way off when he would try to pick something up. He reports R hand numbness too, specially when he would try to touch his head. The symptoms have been gradually improving and now can trol his R hand. Still feels a little numb and a little incoordinated.  Never had anything like this in the past. No prior hx of stroke. No Diabetes, no high cholesterol.  NIHSS: 1 MRS: 0 TPA: outside the window Thrombectomy: Low NIHSS, too mild and spontaneously improving.  ROS   Constitutional Denies weight loss, fever and chills.   HEENT Denies changes in vision and hearing.   Respiratory Denies SOB and cough.   CV Denies palpitations and CP   GI Denies abdominal pain, nausea, vomiting and diarrhea.   GU Denies dysuria and urinary frequency.   MSK Denies myalgia and joint pain.   Skin Denies rash and pruritus.   Neurological Denies headache and syncope.   Psychiatric Denies recent changes in mood. Denies anxiety and depression.    Past History   Past Medical History:  Diagnosis Date  . Hypertension    Past Surgical History:  Procedure Laterality Date  . APPENDECTOMY    . BACK SURGERY     History reviewed. No pertinent family history. Social History   Socioeconomic History  . Marital status: Single    Spouse name: Not on file  . Number of children: Not on file  . Years of education: Not on file  . Highest education level: Not on file  Occupational  History  . Not on file  Tobacco Use  . Smoking status: Former Smoker    Years: 30.00    Types: Cigarettes  . Smokeless tobacco: Never Used  Substance and Sexual Activity  . Alcohol use: Not Currently  . Drug use: Not on file  . Sexual activity: Not on file  Other Topics Concern  . Not on file  Social History Narrative  . Not on file   Social Determinants of Health   Financial Resource Strain: Not on file  Food Insecurity: Not on file  Transportation Needs: Not on file  Physical Activity: Not on file  Stress: Not on file  Social Connections: Not on file   No Known Allergies  Medications   Medications Prior to Admission  Medication Sig Dispense Refill Last Dose  . amLODipine (NORVASC) 10 MG tablet Take 1 tablet by mouth daily.   08/29/2020 at Unknown time     Vitals   Vitals:   08/29/20 1007 08/29/20 1008 08/29/20 1503 08/29/20 1542  BP: (!) 162/76  (!) 155/82 (!) 167/82  Pulse: 82  72 69  Resp: 14  16 16   Temp: 97.6 F (36.4 C)  98 F (36.7 C) 97.6 F (36.4 C)  TempSrc: Oral  Oral   SpO2: 98%  99%  98%  Weight:  66.2 kg    Height:  5\' 9"  (1.753 m)       Body mass index is 21.56 kg/m.  Physical Exam   General: Laying comfortably in bed; in no acute distress.  HENT: Normal oropharynx and mucosa. Normal external appearance of ears and nose.  Neck: Supple, no pain or tenderness  CV: No JVD. No peripheral edema.  Pulmonary: Symmetric Chest rise. Normal respiratory effort.  Abdomen: Soft to touch, non-tender.  Ext: No cyanosis, edema, or deformity  Skin: No rash. Normal palpation of skin.   Musculoskeletal: Normal digits and nails by inspection. No clubbing.   Neurologic Examination  Mental status/Cognition: Alert, oriented to self, place, month and year, good attention.  Speech/language: Fluent, comprehension intact, object naming intact, repetition intact.  Cranial nerves:   CN II Pupils equal and reactive to light, no VF deficits    CN III,IV,VI EOM  intact, no gaze preference or deviation, no nystagmus    CN V normal sensation in V1, V2, and V3 segments bilaterally   CN VII no asymmetry, no nasolabial fold flattening   CN VIII normal hearing to speech    CN IX & X normal palatal elevation, no uvular deviation    CN XI 5/5 head turn and 5/5 shoulder shrug bilaterally    CN XII midline tongue protrusion   Motor:  Muscle bulk: normal, tone normal, pronator drift none tremor none Mvmt Root Nerve  Muscle Right Left Comments  SA C5/6 Ax Deltoid 5 5   EF C5/6 Mc Biceps 5 5   EE C6/7/8 Rad Triceps 5 5   WF C6/7 Med FCR     WE C7/8 PIN ECU     F Ab C8/T1 U ADM/FDI 5 5   HF L1/2/3 Fem Illopsoas 5 5   KE L2/3/4 Fem Quad 5 5   DF L4/5 D Peron Tib Ant 5 5   PF S1/2 Tibial Grc/Sol 5 5    Reflexes:  Right Left Comments  Pectoralis      Biceps (C5/6) 2 2   Brachioradialis (C5/6) 2 2    Triceps (C6/7) 2 2    Patellar (L3/4) 2 2    Achilles (S1)      Hoffman      Plantar     Jaw jerk    Sensation:  Light touch intact   Pin prick    Temperature    Vibration   Proprioception    Coordination/Complex Motor:  - Finger to Nose with mild Ataxia in RUE - Heel to shin intact BL - Rapid alternating movement are normal BL. - Gait: Deferred.  Labs   CBC:  Recent Labs  Lab 08/29/20 1013  WBC 6.5  NEUTROABS 4.0  HGB 15.4  HCT 44.1  MCV 98.7  PLT 179    Basic Metabolic Panel:  Lab Results  Component Value Date   NA 140 08/29/2020   K 4.0 08/29/2020   CO2 24 08/29/2020   GLUCOSE 115 (H) 08/29/2020   BUN 17 08/29/2020   CREATININE 1.13 08/29/2020   CALCIUM 9.1 08/29/2020   GFRNONAA >60 08/29/2020   Lipid Panel: No results found for: LDLCALC HgbA1c: No results found for: HGBA1C Urine Drug Screen: No results found for: LABOPIA, COCAINSCRNUR, LABBENZ, AMPHETMU, THCU, LABBARB  Alcohol Level No results found for: ETH  CT Head without contrast: Atrophy with small vessel chronic ischemic changes of deep cerebral white  matter. 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 Small age-indeterminate LEFT  frontal lobe infarct. No additional intracranial abnormalities.  MR angio Head and Carotid duplex: Pending.  MRI Brain  Pending.  Impression   Rodney James is a 75 y.o. male with PMH significant for HTN who presents with acute onset R hand weakness and numbness that has significantly improved with now very mild RUE ataxia. CTH concerning for a small age indeterminate L frontal stroke. MRI to clarify but his presentation is concerning for a minor ischemic stroke or TIA.   Recommendations  Plan:  - Frequent Neuro checks per stroke unit protocol - Recommend brain imaging with MRI Brain without contrast - Recommend Vascular imaging with MRA Angio Head without contrast and US Carotid doppler - Recommend obtaining TTE pending. - Recommend obtaining Lipid panel with LDL - Please start statin if LDL > 70 - Recommend HbA1c - Antithrombotic - Aspirin 81mg  daily. - Recommend DVT ppx - SBP goal - permissive hypertension first 24 h < 220/110. Held home meds.  - Recommend Telemetry monitoring for arrythmia - Recommend bedside swallow screen prior to PO intake. - Stroke education booklet - Recommend PT/OT/SLP consult - Recommend Urine Tox screen.  ______________________________________________________________________   Thank you for the opportunity to take part in the care of this patient. If you have any further questions, please contact the neurology consultation attending.  Signed,  [facility] Pager Number Erick [phone] _ _ _   _ __   _ __ _ _  __ __   _ __   __ _

## 2020-08-29 NOTE — ED Triage Notes (Signed)
Says woke this am with difficulty controlling right arm.  Right face feels numb or funny.  Says no probem with leg.  He is alert and oriented.

## 2020-08-29 NOTE — H&P (Signed)
History and Physical    Rodney James LGX:[id number] DOB: 1946/02/28 DOA: 08/29/2020  Referring MD/NP/PA:   PCP: Pcp, No   Patient coming from:  The patient is coming from home.  At baseline, pt is independent for most of ADL.        Chief Complaint: right arm numbness and weakness  HPI: Rodney James is a 75 y.o. male with medical history significant of HTN, who present with right arm numbness and weakness.  Pt states that he went to bed at about 7 PM last night and felt normal at that time. He woke up early morning and noted that his R hand was difficult to control.  He has numbness and weakness in the right arm.  His legs are normal.  No difficulty speaking, difficulty swallowing, vision loss or hearing loss.  He states that he had a cyst removed in the right face 2 years ago, has been feeling some numbness in the right face which is normal to him. Patient does not have chest pain, cough, shortness breath, fever or chills.  No nausea, vomiting, diarrhea or abdominal pain.  No symptoms of UTI.  ED Course: pt was found to have WBC 6.5, INR 1.0, PTT 28, pending COVID-19 PCR, GFR> 60, temperature normal, blood pressure 162/76, heart rate 82, RR 14, oxygen saturation 92% on room air.  Patient is placed on MedSurg bed for observation, Dr. Derry Lory Of neurology is consulted.  CT-head showed: Atrophy with small vessel chronic ischemic changes of deep cerebral white matter.  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  Small age-indeterminate LEFT frontal lobe infarct.  No additional intracranial abnormalities.   Review of Systems:   General: no fevers, chills, no body weight gain, has fatigue HEENT: no blurry vision, hearing changes or sore throat Respiratory: no dyspnea, coughing, wheezing CV: no chest pain, no palpitations GI: no nausea, vomiting, abdominal pain, diarrhea, constipation GU: no dysuria, burning on urination, increased urinary frequency, hematuria  Ext:  no leg edema Neuro: No vision change or hearing loss.  Has weakness and numbness in right arm.  Has chronic right facial numbness. Skin: no rash, no skin tear. MSK: No muscle spasm, no deformity, no limitation of range of movement in spin Heme: No easy bruising.  Travel history: No recent long distant travel.  Allergy: No Known Allergies  Past Medical History:  Diagnosis Date  . Hypertension     Past Surgical History:  Procedure Laterality Date  . APPENDECTOMY    . BACK SURGERY      Social History:  reports that he has quit smoking. His smoking use included cigarettes. He quit after 30.00 years of use. He has never used smokeless tobacco. He reports previous alcohol use. He reports that he does not use drugs.  Family History:  Family History  Problem Relation Age of Onset  . Stroke Mother   . Diabetes Mellitus II Mother   . Hypertension Mother   . Stomach cancer Father      Prior to Admission medications   Not on File    Physical Exam: Vitals:   08/29/20 1007 08/29/20 1008 08/29/20 1503 08/29/20 1542  BP: (!) 162/76  (!) 155/82 (!) 167/82  Pulse: 82  72 69  Resp: 14  16 16   Temp: 97.6 F (36.4 C)  98 F (36.7 C) 97.6 F (36.4 C)  TempSrc: Oral  Oral   SpO2: 98%  99% 98%  Weight:  66.2 kg    Height:  5\' 9"  (  1.753 m)     General: Not in acute distress HEENT:       Eyes: PERRL, EOMI, no scleral icterus.       ENT: No discharge from the ears and nose, no pharynx injection, no tonsillar enlargement.        Neck: No JVD, no bruit, no mass felt. Heme: No neck lymph node enlargement. Cardiac: S1/S2, RRR, No murmurs, No gallops or rubs. Respiratory: No rales, wheezing, rhonchi or rubs. GI: Soft, nondistended, nontender, no rebound pain, no organomegaly, BS present. GU: No hematuria Ext: No pitting leg edema bilaterally. 1+DP/PT pulse bilaterally. Musculoskeletal: No joint deformities, No joint redness or warmth, no limitation of ROM in spin. Skin: No rashes.   Neuro: Alert, oriented X3, cranial nerves II-XII grossly intact, right arm is slightly weaker than the left, sensation to light touch intact. Brachial reflex 2+ bilaterally. Psych: Patient is not psychotic, no suicidal or hemocidal ideation.  Labs on Admission: I have personally reviewed following labs and imaging studies  CBC: Recent Labs  Lab 08/29/20 1013  WBC 6.5  NEUTROABS 4.0  HGB 15.4  HCT 44.1  MCV 98.7  PLT 179   Basic Metabolic Panel: Recent Labs  Lab 08/29/20 1013  NA 140  K 4.0  CL 108  CO2 24  GLUCOSE 115*  BUN 17  CREATININE 1.13  CALCIUM 9.1   GFR: Estimated Creatinine Clearance: 51.3 mL/min (by C-G formula based on SCr of 1.13 mg/dL). Liver Function Tests: Recent Labs  Lab 08/29/20 1013  AST 22  ALT 18  ALKPHOS 69  BILITOT 0.8  PROT 7.7  ALBUMIN 4.5   No results for input(s): LIPASE, AMYLASE in the last 168 hours. No results for input(s): AMMONIA in the last 168 hours. Coagulation Profile: Recent Labs  Lab 08/29/20 1038  INR 1.0   Cardiac Enzymes: No results for input(s): CKTOTAL, CKMB, CKMBINDEX, TROPONINI in the last 168 hours. BNP (last 3 results) No results for input(s): PROBNP in the last 8760 hours. HbA1C: No results for input(s): HGBA1C in the last 72 hours. CBG: No results for input(s): GLUCAP in the last 168 hours. Lipid Profile: No results for input(s): CHOL, HDL, LDLCALC, TRIG, CHOLHDL, LDLDIRECT in the last 72 hours. Thyroid Function Tests: No results for input(s): TSH, T4TOTAL, FREET4, T3FREE, THYROIDAB in the last 72 hours. Anemia Panel: No results for input(s): VITAMINB12, FOLATE, FERRITIN, TIBC, IRON, RETICCTPCT in the last 72 hours. Urine analysis: No results found for: COLORURINE, APPEARANCEUR, LABSPEC, PHURINE, GLUCOSEU, HGBUR, BILIRUBINUR, KETONESUR, PROTEINUR, UROBILINOGEN, NITRITE, LEUKOCYTESUR Sepsis Labs: @LABRCNTIP (procalcitonin:4,lacticidven:4) )No results found for this or any previous visit (from the  past 240 hour(s)).   Radiological Exams on Admission: CT HEAD WO CONTRAST  Result Date: 08/29/2020 CLINICAL DATA:  Awoke this morning with difficulty controlling RIGHT arm, RIGHT side numbness, RIGHT side of face feels numb are funny, history hypertension EXAM: CT HEAD WITHOUT CONTRAST TECHNIQUE: Contiguous axial images were obtained from the base of the skull through the vertex without intravenous contrast. Sagittal and coronal MPR images reconstructed from axial data set. COMPARISON:  None FINDINGS: Brain: Generalized atrophy. Normal ventricular morphology. No midline shift or mass effect. Small vessel chronic ischemic changes of deep cerebral white matter. 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 Small age-indeterminate LEFT frontal lobe infarct. No intracranial hemorrhage, mass lesion, or additional infarct identified. No extra-axial fluid collections. Vascular: No hyperdense vessels. Atherosclerotic calcification of internal carotid arteries at skull base Skull: Intact Sinuses/Orbits: Clear Other: N/A IMPRESSION: Atrophy with small vessel chronic  ischemic changes of deep cerebral white matter. 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 Small age-indeterminate LEFT frontal lobe infarct. No additional intracranial abnormalities. Electronically Signed   By: Ulyses Southward M.D.   On: 08/29/2020 11:18     EKG: I have personally reviewed.  Sinus rhythm, low voltage, QTC 428, anteroseptal infarction pattern  Assessment/Plan Principal Problem:   Stroke [facility]) Active Problems:   Hypertension   Stroke [facility]): Patient symptoms is concerning for stroke. Dr. Derry Lory of neuro is consulted.  -Placed on MedSurg bed for observation - will follow up Neurology's Recs.  - Obtain MRI/MRA  - will hold oral Bp meds to allow permissive HTN in the setting of acute stroke, for SBP>220 or dBP>110 - Check carotid dopplers  - ASA and lipitor - fasting lipid panel and HbA1c  - 2D transthoracic  echocardiography  - swallowing screen. If fails, will get SLP - Check UDS  - PT/OT consult  Hypertension -IV hydralazine for  SBP>220 or dBP>110 -hold amlodipin    DVT ppx: SQ Lovenox Code Status: Full code Family Communication: Yes, patient's son    at bed side Disposition Plan:  Anticipate discharge back to previous environment Consults called: Dr. Derry Lory of neurology Admission status and Level of care: Med-Surg: for obs   Status is: Observation  The patient remains OBS appropriate and will d/c before 2 midnights.  Dispo: The patient is from: Home              Anticipated d/c is to: Home              Patient currently is not medically stable to d/c.   Difficult to place patient No         Date of Service 08/29/2020    Lorretta Harp [facility]   If 7PM-7AM, please contact night-coverage [email or web address] 08/29/2020, 5:48 PM

## 2020-08-29 NOTE — ED Provider Notes (Signed)
[facility] Emergency Department Provider Note   ____________________________________________    I have reviewed the triage vital signs and the nursing notes.   HISTORY  Chief Complaint Numbness     HPI Rodney James is a 75 y.o. male who presents with complaints of right arm numbness.  Patient describes that he woke up this morning and felt that his "right arm was out of his control ".  He describes it feeling numb and not able to move it as expected.  Left arm and other extremities normal.  No headaches.  No nausea vomiting or diaphoresis.  He reports it was different from sleeping on it wrong because it did not improve within a few minutes.  He states it is better now although still feels heavy and slightly numb.  Past Medical History:  Diagnosis Date  . Hypertension     Patient Active Problem List   Diagnosis Date Noted  . Stroke (cerebrum) (HCC) 08/29/2020  . Stroke (HCC) 08/29/2020  . Hypertension     Past Surgical History:  Procedure Laterality Date  . APPENDECTOMY    . BACK SURGERY      Prior to Admission medications   Medication Sig Start Date End Date Taking? Authorizing Provider  amLODipine (NORVASC) 10 MG tablet Take 1 tablet by mouth daily. 06/23/20  Yes [provider]     Allergies Patient has no known allergies.  No family history on file.  Social History Social History   Tobacco Use  . Smoking status: Never Smoker  . Smokeless tobacco: Never Used  Substance Use Topics  . Alcohol use: Not Currently    Review of Systems  Constitutional: No fever/chills Eyes: No visual changes.  ENT: No sore throat. Cardiovascular: Denies chest pain. Respiratory: Denies shortness of breath. Gastrointestinal: No abdominal pain.  No nausea, no vomiting.   Genitourinary: Negative for dysuria. Musculoskeletal: Negative for back pain. Skin: Negative for rash. Neurological: As  above   ____________________________________________   PHYSICAL EXAM:  VITAL SIGNS: ED Triage Vitals  Enc Vitals Group     BP 08/29/20 1007 (!) 162/76     Pulse Rate 08/29/20 1007 82     Resp 08/29/20 1007 14     Temp 08/29/20 1007 97.6 F (36.4 C)     Temp Source 08/29/20 1007 Oral     SpO2 08/29/20 1007 98 %     Weight 08/29/20 1008 66.2 kg (146 lb)     Height 08/29/20 1008 1.753 m (5\' 9" )     Head Circumference --      Peak Flow --      Pain Score 08/29/20 1008 0     Pain Loc --      Pain Edu? --      Excl. in GC? --     Constitutional: Alert and oriented. Eyes: Conjunctivae are normal.  PERRLA, EOMI  Nose: No congestion/rhinnorhea. Mouth/Throat: Mucous membranes are moist.    Cardiovascular: Normal rate, regular rhythm. Grossly normal heart sounds.  Good peripheral circulation. Respiratory: Normal respiratory effort.  No retractions. Lungs CTAB. Gastrointestinal: Soft and nontender. No distention. s.  Musculoskeletal: No lower extremity tenderness nor edema.  Warm and well perfused Neurologic:  Normal speech and language. No gross focal neurologic deficits are appreciated.  Upper extremity strength seems equal, cranial nerves II to XII are normal Skin:  Skin is warm, dry and intact. No rash noted. Psychiatric: Mood and affect are normal. Speech and behavior are normal.  ____________________________________________  LABS (all labs ordered are listed, but only abnormal results are displayed)  Labs Reviewed  CBC - Abnormal; Notable for the following components:      Result Value   MCH 34.5 (*)    All other components within normal limits  COMPREHENSIVE METABOLIC PANEL - Abnormal; Notable for the following components:   Glucose, Bld 115 (*)    All other components within normal limits  SARS CORONAVIRUS 2 (TAT 6-24 HRS)  DIFFERENTIAL  PROTIME-INR  APTT   ____________________________________________  EKG  ED ECG REPORT I, Jene Every, the attending  physician, personally viewed and interpreted this ECG.  Date: 08/29/2020  Rhythm: normal sinus rhythm QRS Axis: normal Intervals: normal ST/T Wave abnormalities: normal Narrative Interpretation: no evidence of acute ischemia  ____________________________________________  RADIOLOGY  CT head, age-indeterminate left frontal infarct ____________________________________________   PROCEDURES  Procedure(s) performed: No  Procedures   Critical Care performed: No ____________________________________________   INITIAL IMPRESSION / ASSESSMENT AND PLAN / ED COURSE  Pertinent labs & imaging results that were available during my care of the patient were reviewed by me and considered in my medical decision making (see chart for details).  Patient's presentation consistent with either radiculopathy/neuropathy versus CVA.  Given his description of heaviness of the right arm concerning for CVA.  Cranial nerves otherwise normal.  NIH O scale of 1.  Lab work is overall reassuring, CT head with age-indeterminate infarct left frontal lobe  We will admit the patient to the hospital service for MRI further evaluation, have consulted neurology    ____________________________________________   FINAL CLINICAL IMPRESSION(S) / ED DIAGNOSES  Final diagnoses:  Cerebrovascular accident (CVA), unspecified mechanism (HCC)        Note:  This document was prepared using Dragon voice recognition software and may include unintentional dictation errors.   Jene Every, MD 08/29/20 1427

## 2020-08-29 NOTE — ED Notes (Signed)
Informed RN patient has bed assigned 1254

## 2020-08-30 ENCOUNTER — Observation Stay (HOSPITAL_BASED_OUTPATIENT_CLINIC_OR_DEPARTMENT_OTHER)
Admit: 2020-08-30 | Discharge: 2020-08-30 | Disposition: A | Discharge status: Home / Self Care | Payer: Medicare HMO | Attending: Internal Medicine | Admitting: Internal Medicine

## 2020-08-30 ENCOUNTER — Observation Stay (HOSPITAL_BASED_OUTPATIENT_CLINIC_OR_DEPARTMENT_OTHER)
Admit: 2020-08-30 | Discharge: 2020-08-30 | Disposition: A | Discharge status: Home / Self Care | Payer: Medicare HMO | Attending: Physician Assistant | Admitting: Physician Assistant

## 2020-08-30 ENCOUNTER — Encounter: Payer: Self-pay | Admitting: Internal Medicine

## 2020-08-30 ENCOUNTER — Telehealth: Payer: Self-pay | Admitting: *Deleted

## 2020-08-30 ENCOUNTER — Observation Stay: Payer: Medicare HMO

## 2020-08-30 DIAGNOSIS — I63512 Cerebral infarction due to unspecified occlusion or stenosis of left middle cerebral artery: Secondary | ICD-10-CM

## 2020-08-30 DIAGNOSIS — I639 Cerebral infarction, unspecified: Secondary | ICD-10-CM

## 2020-08-30 DIAGNOSIS — I10 Essential (primary) hypertension: Secondary | ICD-10-CM | POA: Diagnosis not present

## 2020-08-30 DIAGNOSIS — I6389 Other cerebral infarction: Secondary | ICD-10-CM | POA: Diagnosis not present

## 2020-08-30 LAB — LIPID PANEL
Cholesterol: 139 mg/dL (ref 0–200)
HDL: 38 mg/dL — ABNORMAL LOW (ref >40)
LDL Cholesterol: 89 mg/dL (ref 0–99)
Total CHOL/HDL Ratio: 3.7 RATIO
Triglycerides: 62 mg/dL (ref <150)
VLDL: 12 mg/dL (ref 0–40)

## 2020-08-30 LAB — ECHOCARDIOGRAM COMPLETE
AR max vel: 2.8 cm2
AV Area VTI: 3.83 cm2
AV Area mean vel: 2.69 cm2
AV Mean grad: 1.5 mmHg
AV Peak grad: 2.8 mmHg
Ao pk vel: 0.83 m/s
Area-P 1/2: 3.12 cm2
Height: 69 in
S' Lateral: 2.8 cm
Weight: 2336 oz

## 2020-08-30 LAB — HEMOGLOBIN A1C
Hgb A1c MFr Bld: 5.4 % (ref 4.8–5.6)
Mean Plasma Glucose: 108.28 mg/dL

## 2020-08-30 IMAGING — CT CT ANGIO NECK
2 of 7 series · 8 of 33 positions shown · IV contrast (APPLIED)
Comparison: Same day carotid ultrasound.

CLINICAL DATA: Carotid stenosis screening.

EXAM:
CT ANGIOGRAPHY NECK
TECHNIQUE: Multidetector CT imaging of the neck was performed using the
standard protocol during bolus administration of intravenous
contrast. Multiplanar CT image reconstructions and MIPs were
obtained to evaluate the vascular anatomy. Carotid stenosis
measurements (when applicable) are obtained utilizing NASCET
criteria, using the distal internal carotid diameter as the
denominator.
CONTRAST:  75mL OMNIPAQUE IOHEXOL 350 MG/ML SOLN

[Series 4: cta neck · axial · 0.42mm/px · z∈[-19,+63]mm · 2 of 123 slices shown]
[im 41/123  soft-tissue]
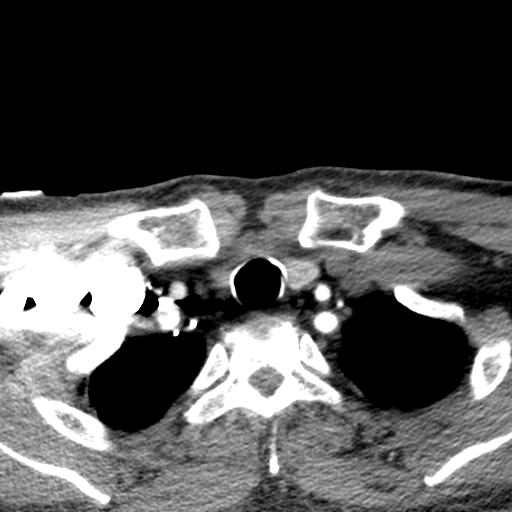
[im 82/123  soft-tissue]
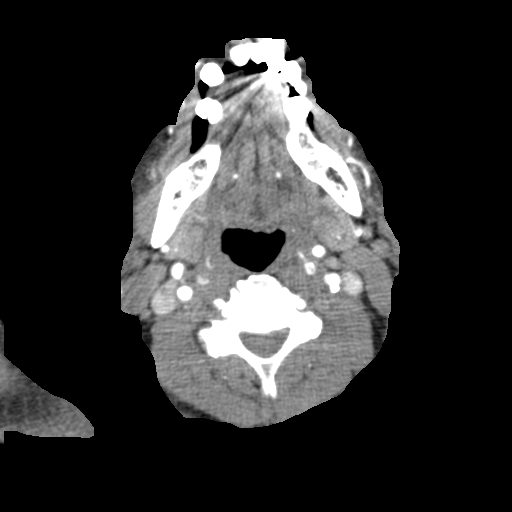

[Series 6: ax thin · axial · 0.41mm/px · z∈[-94,+93]mm · 6 of 274 slices shown]
[im 40/274  soft-tissue]
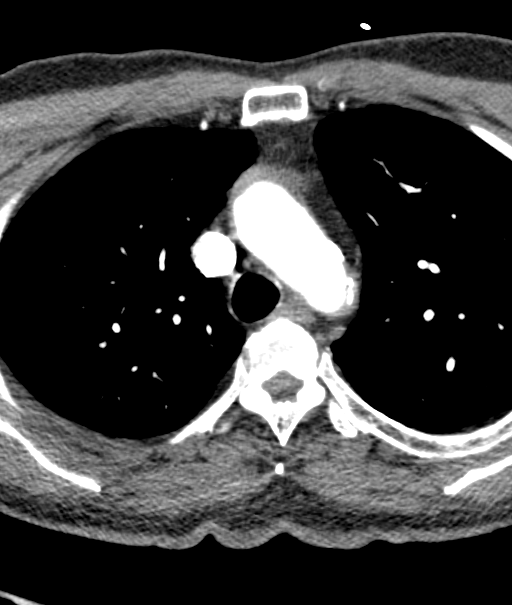
[im 79/274  bone]
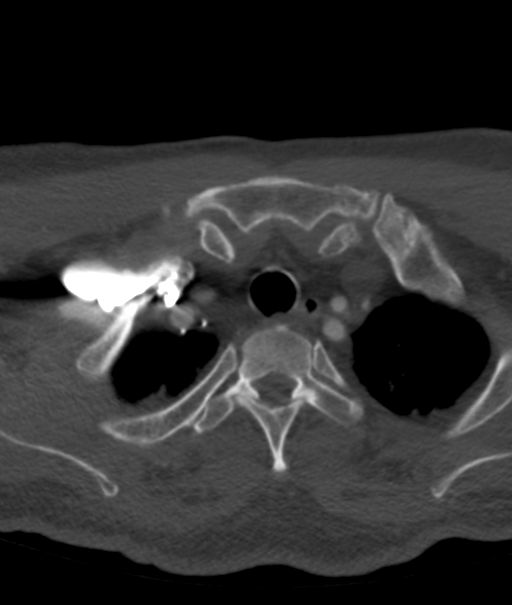
[im 118/274  soft-tissue]
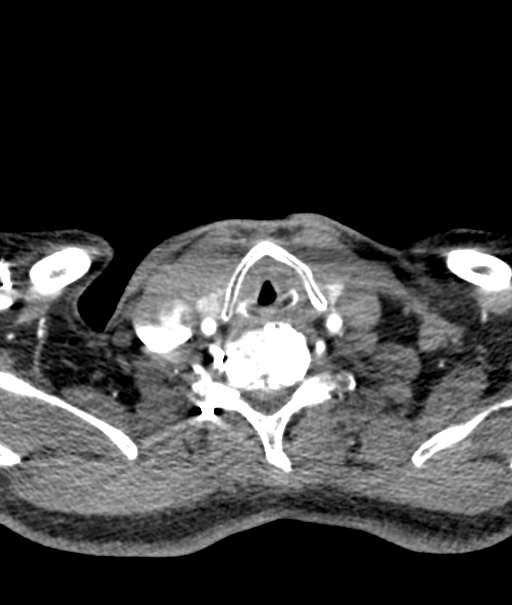
[im 157/274  bone]
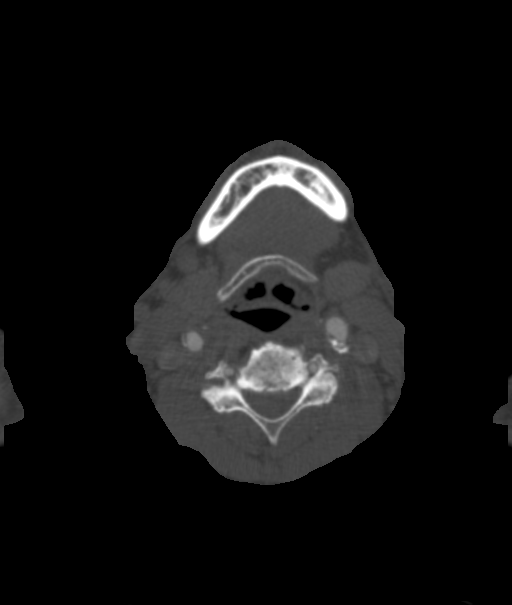
[im 196/274  soft-tissue]
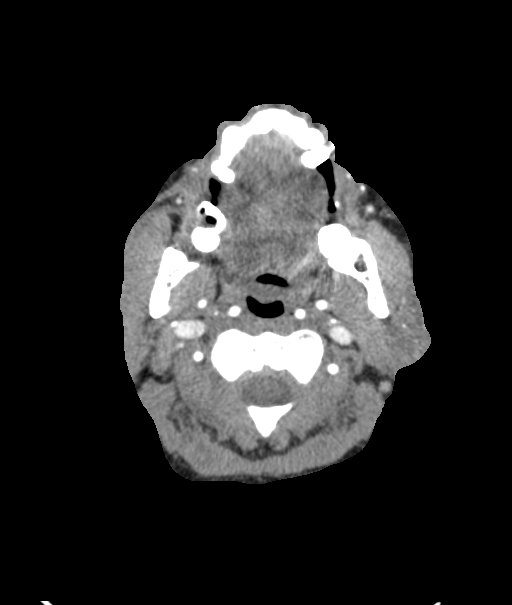
[im 235/274  bone]
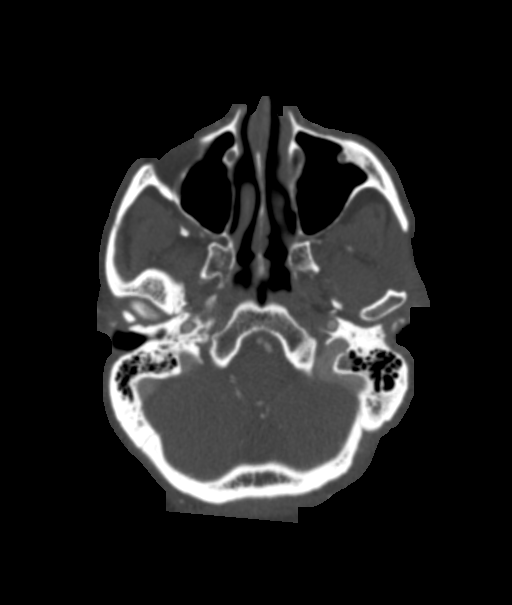

[8 of 33 positions shown; findings below may reference images not displayed]

FINDINGS: Aortic arch: Calcific atherosclerosis of the visualized aorta. Great
vessel origins are patent.

Right carotid system: Mixed calcific and noncalcific atherosclerosis
at the carotid bifurcation with approximately 30% stenosis of the
proximal ICA.

Left carotid system: Mixed calcific and noncalcific atherosclerosis
at the carotid bifurcation with approximately 30z% stenosis of the
proximal ICA.

Vertebral arteries: Codominant. The proximal right vertebral artery
is obscured by streak artifact from adjacent pooled venous contrast.
Approximately 60% stenosis of the left vertebral artery origin.
Remainder of the vertebral arteries are patent. Small shelf-like
filling defect of the posterior aspect of the right carotid bulb
(series 8, image 81), compatible with a carotid web.

Skeleton: Severe degenerative disc disease at multiple levels, most
pronounced at C4-C5 where there is disc height loss, endplate
sclerosis and posterior disc osteophyte complex.

Other neck: No mass or suspicious adenopathy.

Upper chest: Emphysema. No consolidation in the visualized lung
apices. Biapical pleuroparenchymal scarring.
IMPRESSION: 1. Approximately 60% stenosis of the left vertebral artery origin.
The proximal right vertebral artery is obscured by streak artifact.
2. Mixed atherosclerosis at the carotid bifurcations with
approximately 30% stenosis of bilateral proximal internal carotid
arteries.
3. Small right carotid web.
4. Approximately 50% stenosis of the right cavernous ICA,
incidentally imaged.
5. Emphysema.

## 2020-08-30 MED ORDER — ASPIRIN EC 81 MG PO TBEC: 81.0000 mg | DELAYED_RELEASE_TABLET | Freq: Every day | ORAL | 2 refills | Status: AC

## 2020-08-30 MED ORDER — ATORVASTATIN CALCIUM 20 MG PO TABS: 40.0000 mg | ORAL_TABLET | Freq: Every day | ORAL | 0 refills | Status: DC

## 2020-08-30 MED ORDER — IOHEXOL 350 MG/ML SOLN
75.0000 mL | Freq: Once | INTRAVENOUS | Status: AC | PRN
  Administered 2020-08-30: 75 mL via INTRAVENOUS

## 2020-08-30 NOTE — Telephone Encounter (Signed)
Ryan, if the patient is being discharged with a Zio to be worn for 2 weeks, should we schedule him with EP after the Zio has resulted to see if the Lauretta Grill is warranted?

## 2020-08-30 NOTE — Discharge Instructions (Signed)
Preventing Atrial Fibrillation-Related Stroke Atrial fibrillation (AFib) is a common type of irregular or rapid heartbeat (arrhythmia) that increases the risk for a stroke. In AFib, the top portions of the heart (atria) beat out of sync with the lower portions of the heart. When the muscles of the atria tighten in an uncoordinated way (fibrillating), blood can pool in the heart and form clots. A stroke can be caused by a blood clot that travels to the brain. This type of stroke is preventable. Understanding AFib and knowing how to properly manage it can prevent a stroke from happening. How can this condition affect me? Having AFib can increase your risk for a stroke. A stroke is a medical emergency. It can lead to brain damage and can sometimes be life-threatening. A stroke can be a life-changing event. What can increase my risk? Having AFib can increase your risk of stroke. Other risk factors include:  Having heart failure.  Having high blood pressure.  Being older than age 23.  Having diabetes.  Having a history of vascular disease, such as heart attack or stroke.  Being male.  Having a history of transient ischemic attacks (TIAs). This is sometimes called a ministroke.  Having a family history of stroke. Risk factors that you can change include:  Smoking.  High cholesterol.  Being inactive (sedentary lifestyle).  Eating a diet that is high in fat, cholesterol, and salt. What actions can I take to prevent this?  Maintain a healthy weight.  Get regular exercise. This can include at least 40 minutes of moderate exercise on most days, such as walking at a fast pace, or vigorous exercise, such as jogging.  Get evaluated for obstructive sleep apnea. Talk to your health care provider about getting a sleep evaluation if you snore a lot or have excessive sleepiness.  Manage any other medical conditions you have, such as hypertension or diabetes. Medicines  Take over-the-counter  and prescription medicines only as told by your health care provider.  If your health care provider prescribed an anticoagulant, take it exactly as told. Taking too much blood-thinning medicine can cause bleeding. Taking too little may not give you the protection that you need against stroke and other problems. Eating and drinking  Eat healthy foods, including at least 5 servings of fruits and vegetables a day.  Do not drink alcohol.  Do not drink beverages that have caffeine, such as coffee, soda, and tea.  Follow instructions about your diet as told by your health care provider. Questions to ask your health care provider Contact a health care provider if:  You notice a change in the rate, rhythm, or strength of your heartbeat.  You feel dizzy.  You are taking an anticoagulant and you have more bruises than usual.  You get tired more easily when you exercise or do similar activities. Get help right away if:  You have chest pain.  You have trouble breathing.  You have pain in your abdomen.  You experience unusual sweating or weakness.  You take anticoagulants and you: ? Have severe headaches or confusion. ? Have blood in your vomit, bowel movement, or urine. ? Have bleeding that will not stop. ? Fall or injure your head.  You have any symptoms of a stroke. "BE FAST" is an easy way to remember the main warning signs of a stroke: ? B - Balance. Signs are dizziness, trouble walking, or loss of balance. ? E - Eyes. Signs are trouble seeing or a sudden change in vision. ?  F - Face. Signs are sudden weakness or numbness of the face, or the face or eyelid drooping on one side. ? A - Arms. Signs are weakness or numbness in an arm. This happens suddenly and usually on one side of the body. ? S - Speech. Signs are sudden trouble speaking, slurred speech, or trouble understanding what people say. ? T - Time. Time to call emergency services. Write down what time symptoms  started.  You have other signs of a stroke, such as: ? A sudden, severe headache with no known cause. ? Nausea or vomiting. ? Seizure. These symptoms may represent a serious problem that is an emergency. Do not wait to see if the symptoms will go away. Get medical help right away. Call your local emergency services (911 in the [place] Do not drive yourself to the hospital.   Summary  Having atrial fibrillation (AFib) can increase the risk for a stroke. Talk with your health care provider about what symptoms to watch for.  AFib-related stroke is preventable. Proper management of AFib can prevent you from having a stroke.  Talk with your health care provider about whether anticoagulant medicine is right for you.  Learn the warning signs of a stroke and remember "BE FAST." This information is not intended to replace advice given to you by your health care provider. Make sure you discuss any questions you have with your health care provider. Document Revised: 01/14/2020 Document Reviewed: 01/14/2020 Elsevier Patient Education  2021 [employer].

## 2020-08-30 NOTE — Telephone Encounter (Signed)
EP will be able to review available data at the time of his visit.  My concern is if his follow up with EP is deferred until after his [employer] is finalized, he will be unmonitored while awaiting EP visit.

## 2020-08-30 NOTE — Progress Notes (Signed)
SLP Cancellation Note  Patient Details Name: Rodney James MRN: [id number] DOB: December 06, 1945   Cancelled treatment:       Reason Eval/Treat Not Completed: SLP screened, no needs identified, will sign off (chart reviewed; consulted NSG then met w/ pt). Pt denied any difficulty swallowing and is currently on a regular diet; tolerates swallowing pills w/ water per NSG. Pt eating his breakfast meal upon entering room w/out overt difficulty noted, feeding self. Pt conversed at conversational level w/out deficits noted; pt denied any speech-language deficits; noted he had been using his cell phone and denied difficulty using that as well.  No further skilled ST services indicated as pt appears at his baseline. Pt agreed. NSG to reconsult if any change in status.      Orinda Kenner, MS, CCC-SLP Speech Language Pathologist Re[facility] [phone] [facility] 08/30/2020, 8:43 AM

## 2020-08-30 NOTE — Progress Notes (Signed)
OT Cancellation Note  Patient Details Name: Rodney James MRN: [id number] DOB: 06-24-1945   Cancelled Treatment:    Reason Eval/Treat Not Completed: OT screened, no needs identified, will sign off. Order received and chart reviewed. Pt is near baseline functional Independence for ADLs and functional mobility, no acute OT needs identified. Will sign off at this time. Thank you.  Kathie Dike, M.S. OTR/L  08/30/20, 10:15 AM  ascom [phone]

## 2020-08-30 NOTE — Evaluation (Signed)
Physical Therapy Evaluation Patient Details Name: Rodney James MRN: [id number] DOB: 1946-01-23 Today's Date: 08/30/2020   History of Present Illness  Patient is a 75 y.o. male with PMH significant for HTN who presents with acute onset R hand weakness and numbness that has significantly improved with now very mild RUE ataxia. CTH concerning for a small age indeterminate L frontal stroke. 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    Clinical Impression  Patient is likely at or nearing baseline level of functional independence. Mild overshooting and noted with finger to nose RUE with strength WFL for functional activity and sensation intact during exam. Educated patient to use RUE with functional activity and was observed to be able to write his name, feed himself, and adjust socks in sitting position without assistance. No focal deficits are noted with BLE as strength, coordination, and sensation appear WFL. Challenged patient occasional cues for technique with high level balance activity including closing eyes for 10 seconds with static standing with no trunk sway noted, reaching outside base of support and overhead, direction changes with ambulation, and head turns with no staggering or loss of balance noted. Patient has good overall safety awareness with functional mobility. No further acute PT needs at this time.     Follow Up Recommendations No PT follow up     Equipment Recommendations  None recommended by PT    Recommendations for Other Services       Precautions / Restrictions Precautions Precautions: Fall Restrictions Weight Bearing Restrictions: No      Mobility  Bed Mobility               General bed mobility comments: not observed as patient sitting up on arrival and  post session    Transfers Overall transfer level: Modified independent               General transfer comment: increased time required as patient reports he has history of feeling lightheaded with standing. no symptoms reported during exam with functional activity and patient demonstrated good safety awareness with functional mobility  Ambulation/Gait Ambulation/Gait assistance: Modified independent (Device/Increase time) Gait Distance (Feet): 40 Feet Assistive device: None Gait Pattern/deviations: Step-through pattern Gait velocity: decreased mildly   General Gait Details: no loss of balance with functional ambulation  Stairs            Wheelchair Mobility    Modified Rankin (Stroke Patients Only)       Balance Overall balance assessment: Independent Sitting-balance support: Feet supported;No upper extremity supported Sitting balance-Leahy Scale: Normal     Standing balance support: During functional activity;No upper extremity supported Standing balance-Leahy Scale: Normal               High level balance activites: Direction changes;Turns;Head turns High Level Balance Comments: challenged patient outside base of support with functional activity with intermittent cues for safety. patient has no loss of balance with above high level balance activity as well as static standing with eyes closed for 10 seconds.             Pertinent Vitals/Pain Pain Assessment: No/denies pain    Home Living Family/patient expects to be discharged to:: Private residence Living Arrangements: Children Available Help at Discharge: Family Type of Home: House Home Access: Stairs to enter   [employer] of Steps: 2 Home Layout:  One level        Prior Function Level of Independence: Independent               Hand Dominance   Dominant Hand: Right    Extremity/Trunk Assessment   Upper Extremity Assessment Upper Extremity Assessment: RUE  deficits/detail;LUE deficits/detail RUE Deficits / Details: 4+/5 shoulder flexion, at least 4/5 elbow flexion/extension (unable to fully assess due to IV placement at elbow) RUE Sensation: WNL RUE Coordination:  (finger to nose with intermittent mild overshooting. rapid alternating movement without dysmetria.) LUE Deficits / Details: LUE WNL, grossly 5/5 strength throughout LUE Sensation: WNL LUE Coordination: WNL;decreased fine motor;decreased gross motor    Lower Extremity Assessment Lower Extremity Assessment: RLE deficits/detail;LLE deficits/detail RLE Deficits / Details: 5/5 dorsiflexion, plantarflexion, hip flexion, hip add/abd, knee extension RLE Sensation: WNL RLE Coordination: WNL (foot tapping and heel-shin slide without dysmetira) LLE Sensation: WNL LLE Coordination: WNL       Communication   Communication: No difficulties  Cognition Arousal/Alertness: Awake/alert Behavior During Therapy: WFL for tasks assessed/performed Overall Cognitive Status: Within Functional Limits for tasks assessed                                        General Comments      Exercises     Assessment/Plan    PT Assessment Patent does not need any further PT services  PT Problem List         PT Treatment Interventions      PT Goals (Current goals can be found in the Care Plan section)       Frequency     Barriers to discharge        Co-evaluation               AM-PAC PT "6 Clicks" Mobility  Outcome Measure Help needed turning from your back to your side while in a flat bed without using bedrails?: None Help needed moving from lying on your back to sitting on the side of a flat bed without using bedrails?: None Help needed moving to and from a bed to a chair (including a wheelchair)?: None Help needed standing up from a chair using your arms (e.g., wheelchair or bedside chair)?: None Help needed to walk in hospital room?: None Help needed climbing 3-5  steps with a railing? : None 6 Click Score: 24    End of Session   Activity Tolerance: Patient tolerated treatment well Patient left:  (sitting up on edge of bed eating breakfast) Nurse Communication:  (white board updated with mobility status, also discussed with Dr. Benjamine Mola) PT Visit Diagnosis: Other symptoms and signs involving the nervous system (R29.898)    Time: 6734-1937 PT Time Calculation (min) (ACUTE ONLY): 25 min   Charges:   PT Evaluation $PT Eval Low Complexity: 1 Low PT Treatments $Neuromuscular Re-education: 8-22 mins        Donna Bernard, PT, MPT   Ina Homes 08/30/2020, 10:03 AM

## 2020-08-30 NOTE — Discharge Summary (Addendum)
Physician Discharge Summary  Rodney James LFY:[id number] DOB: [birth year] DOA: 08/29/2020  PCP: Pcp, No  Admit date: 08/29/2020 Discharge date: 08/30/2020  Admitted From: home Discharge disposition: home   Recommendations for Outpatient Follow-Up:   1. Asa 81 mg 2. Statin added 3. Event monitor until loop can be placed outpatient 4. Will need neurology as well as NS follow up 5. PFTs as an outpatient   Discharge Diagnosis:   Principal Problem:   Stroke [facility]) Active Problems:   Hypertension    Discharge Condition: Improved.  Diet recommendation: Low sodium, heart healthy  Wound care: None.  Code status: Full.   History of Present Illness:   Rodney James is a 75 y.o. male with medical history significant of HTN, who present with right arm numbness and weakness.  Pt states that he went to bed at about 7 PM last night and felt normal at that time. He woke up early morning and noted that his R hand was difficult to control.  He has numbness and weakness in the right arm.  His legs are normal.  No difficulty speaking, difficulty swallowing, vision loss or hearing loss.  He states that he had a cyst removed in the right face 2 years ago, has been feeling some numbness in the right face which is normal to him. Patient does not have chest pain, cough, shortness breath, fever or chills.  No nausea, vomiting, diarrhea or abdominal pain.  No symptoms of UTI.   Hospital Course by Problem:   Rodney James is a 75 y.o. male withPMH significant for HTN who presents with acute onset R hand weakness and numbness has significantly improved with now very mild RUE ataxia. 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 MR Angio head w/o contrast with no LVO. Tiny 23mm Aneurysm    Medical Consultants:    neurology   Discharge Exam:   Vitals:    08/30/20 0830 08/30/20 1226  BP: 140/77 (!) 158/68  Pulse: 71 61  Resp: 16 16  Temp: 98.3 F (36.8 C) (!) 97.4 F (36.3 C)  SpO2: 98% 99%   Vitals:   08/30/20 0026 08/30/20 0436 08/30/20 0830 08/30/20 1226  BP: 136/70 133/67 140/77 (!) 158/68  Pulse: 68 66 71 61  Resp: 17 16 16 16   Temp: 97.8 F (36.6 C) 98.5 F (36.9 C) 98.3 F (36.8 C) (!) 97.4 F (36.3 C)  TempSrc: Oral Oral    SpO2: 99% 98% 98% 99%  Weight:      Height:        General exam: Appears calm and comfortable.   The results of significant diagnostics from this hospitalization (including imaging, microbiology, ancillary and laboratory) are listed below for reference.     Procedures and Diagnostic Studies:   CT ANGIO NECK W OR WO CONTRAST  Result Date: 08/30/2020 CLINICAL DATA:  Carotid stenosis screening. EXAM: CT ANGIOGRAPHY NECK TECHNIQUE: Multidetector CT imaging of the neck was performed using the standard protocol during bolus administration of intravenous contrast. Multiplanar CT image reconstructions and MIPs were obtained to evaluate the vascular anatomy. Carotid stenosis measurements (when applicable) are obtained utilizing NASCET criteria, using the distal internal carotid diameter as the denominator. CONTRAST:  74mL OMNIPAQUE IOHEXOL 350 MG/ML SOLN COMPARISON:  Same day carotid ultrasound. FINDINGS: Aortic arch: Calcific atherosclerosis of the visualized aorta. Great vessel origins are patent. Right carotid system: Mixed calcific  and noncalcific atherosclerosis at the carotid bifurcation with approximately 30% stenosis of the proximal ICA. Left carotid system: Mixed calcific and noncalcific atherosclerosis at the carotid bifurcation with approximately 30z% stenosis of the proximal ICA. Vertebral arteries: Codominant. The proximal right vertebral artery is obscured by streak artifact from adjacent pooled venous contrast. 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 Remainder of the  vertebral arteries are patent. Small shelf-like filling defect of the posterior aspect of the right carotid bulb (series 8, image 81), compatible with a carotid web. 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 Other neck: No mass or suspicious adenopathy. Upper chest: Emphysema. No consolidation in the visualized lung apices. Biapical pleuroparenchymal scarring. IMPRESSION: 1. 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 The proximal right vertebral artery is obscured by streak artifact. 2. Mixed atherosclerosis at the carotid bifurcations with approximately 30% stenosis of bilateral proximal internal carotid arteries. 3. Small right carotid web. 4. Approximately 50% stenosis of the right cavernous ICA, incidentally imaged. 5. Emphysema. Electronically Signed   By: Feliberto Harts MD   On: 08/30/2020 10:12   US Carotid Bilateral (at [facility] and [facility] only)  Result Date: 08/30/2020 CLINICAL DATA:  Initial evaluation for acute stroke. EXAM: BILATERAL CAROTID DUPLEX ULTRASOUND TECHNIQUE: Wallace Cullens scale imaging, color Doppler and duplex ultrasound were performed of bilateral carotid and vertebral arteries in the neck. COMPARISON:  Corresponding MRI from 08/29/2020. FINDINGS: Criteria: Quantification of carotid stenosis is based on velocity parameters that correlate the residual internal carotid diameter with NASCET-based stenosis levels, using the diameter of the distal internal carotid lumen as the denominator for stenosis measurement. The following velocity measurements were obtained: RIGHT ICA: 102/32 cm/sec CCA: 89/17 cm/sec SYSTOLIC ICA/CCA RATIO:  1.1 ECA: 109 cm/sec LEFT ICA: 115/34 cm/sec CCA: 94/23 cm/sec SYSTOLIC ICA/CCA RATIO:  1.2 ECA: 96 cm/sec RIGHT CAROTID ARTERY: Mild diffuse intimal thickening seen throughout the visualized right CCA without significant  stenosis. Echogenic plaque with mild irregularity seen about the right carotid bulb. No elevation in peak systolic velocity to suggest hemodynamically significant stenosis. Right ICA widely patent distally. RIGHT VERTEBRAL ARTERY:  Antegrade. LEFT CAROTID ARTERY: Mild diffuse intimal thickening within the left CCA without stenosis. Irregular echogenic atheromatous plaque seen about the left carotid bulb with extension into the proximal left ICA, slightly more severe as compared to the contralateral right side. No elevation in peak systolic velocity to suggest hemodynamically significant stenosis. Left ICA widely patent distally. LEFT VERTEBRAL ARTERY:  Antegrade. IMPRESSION: 1. Irregular echogenic plaque about the carotid bulb/proximal ICAs bilaterally, left greater than right, but without hemodynamically significant stenosis. Estimated stenosis 0-49% by ultrasound criteria. 2. Patent antegrade flow within both vertebral arteries within the neck. Electronically Signed   By: Rise Mu M.D.   On: 08/30/2020 02:58     Labs:   Basic Metabolic Panel: Recent Labs  Lab 08/29/20 1013  NA 140  K 4.0  CL 108  CO2 24  GLUCOSE 115*  BUN 17  CREATININE 1.13  CALCIUM 9.1   GFR Estimated Creatinine Clearance: 51.3 mL/min (by C-G formula based on SCr of 1.13 mg/dL). Liver Function Tests: Recent Labs  Lab 08/29/20 1013  AST 22  ALT 18  ALKPHOS 69  BILITOT 0.8  PROT 7.7  ALBUMIN 4.5   No results for input(s): LIPASE, AMYLASE in the last 168 hours. No results for input(s): AMMONIA in the last 168  hours. Coagulation profile Recent Labs  Lab 08/29/20 1038  INR 1.0    CBC: Recent Labs  Lab 08/29/20 1013  WBC 6.5  NEUTROABS 4.0  HGB 15.4  HCT 44.1  MCV 98.7  PLT 179   Cardiac Enzymes: No results for input(s): CKTOTAL, CKMB, CKMBINDEX, TROPONINI in the last 168 hours. BNP: Invalid input(s): POCBNP CBG: No results for input(s): GLUCAP in the last 168 hours. D-Dimer No  results for input(s): DDIMER in the last 72 hours. Hgb A1c Recent Labs    08/30/20 0543  HGBA1C 5.4   Lipid Profile Recent Labs    08/30/20 0543  CHOL 139  HDL 38*  LDLCALC 89  TRIG 62  CHOLHDL 3.7   Thyroid function studies No results for input(s): TSH, T4TOTAL, T3FREE, THYROIDAB in the last 72 hours.  Invalid input(s): FREET3 Anemia work up No results for input(s): VITAMINB12, FOLATE, FERRITIN, TIBC, IRON, RETICCTPCT in the last 72 hours. Microbiology Recent Results (from the past 240 hour(s))  SARS CORONAVIRUS 2 (TAT 6-24 HRS) Nasopharyngeal Nasopharyngeal Swab     Status: None   Collection Time: 08/29/20 11:54 AM   Specimen: Nasopharyngeal Swab  Result Value Ref Range Status   SARS Coronavirus 2 NEGATIVE NEGATIVE Final    Comment: (NOTE) SARS-CoV-2 target nucleic acids are NOT DETECTED.  The SARS-CoV-2 RNA is generally detectable in upper and lower respiratory specimens during the acute phase of infection. Negative results do not preclude SARS-CoV-2 infection, do not rule out co-infections with other pathogens, and should not be used as the sole basis for treatment or other patient management decisions. Negative results must be combined with clinical observations, patient history, and epidemiological information. The expected result is Negative.  Fact Sheet for Patients: HairSlick.no  Fact Sheet for Healthcare Providers: quierodirigir.com  This test is not yet approved or cleared by the [place] FDA and  has been authorized for detection and/or diagnosis of SARS-CoV-2 by FDA under an Emergency Use Authorization (EUA). This EUA will remain  in effect (meaning this test can be used) for the duration of the COVID-19 declaration under Se ction 564(b)(1) of the Act, 21 U.S.C. section 360bbb-3(b)(1), unless the authorization is terminated or revoked sooner.  Performed at [facility], [address] [place], [place] [address]      Discharge Instructions:   Discharge Instructions    Ambulatory referral to Neurology   Complete by: As directed    An appointment is requested in approximately: {6 weeks   Diet - low sodium heart healthy   Complete by: As directed    Increase activity slowly   Complete by: As directed    Increase activity slowly   Complete by: As directed      Allergies as of 08/30/2020   No Known Allergies     Medication List    TAKE these medications   amLODipine 10 MG tablet Commonly known as: NORVASC Take 1 tablet by mouth daily.   aspirin EC 81 MG tablet Take 1 tablet (81 mg total) by mouth daily. Swallow whole.   atorvastatin 20 MG tablet Commonly known as: LIPITOR Take 2 tablets (40 mg total) by mouth daily. Start taking on: August 31, 2020         Time coordinating discharge: 35 min  Signed:  Joseph Art DO  [facility] 08/30/2020, 2:47 PM

## 2020-08-30 NOTE — Plan of Care (Signed)

## 2020-08-30 NOTE — Telephone Encounter (Signed)
Noted- to scheduling to call the patient to arrange for an EP follow up appointment. Will need to be with Dr. Lalla Brothers in [place] as Dr. Graciela Husbands is out of the office for the next 3 weeks. If no [place] availability, may need to check with Harrietta Guardian, EP scheduler for [place] for availability.   I will go ahead and send a message to precert regarding a LINQ implant in preparation of the upcoming EP appointment.

## 2020-08-30 NOTE — Progress Notes (Signed)
NEUROLOGY CONSULTATION PROGRESS NOTE   Date of service: August 30, 2020 Patient Name: Rodney James MRN:  [id number] DOB:  [birth year]  Brief HPI  Rodney James is a 75 y.o. male with PMH significant for HTN who presents with acute onset R hand weakness and numbness has significantly improved with now very mild RUE ataxia. 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 MR Angio head w/o contrast with no LVO. Tiny 33mm Aneurysm.   Interval Hx   Weakness continues to improve and very mild R elbow flexion weakness on exam.  Vitals   Vitals:   08/29/20 2225 08/30/20 0026 08/30/20 0436 08/30/20 0830  BP: (!) 150/69 136/70 133/67 140/77  Pulse: 77 68 66 71  Resp: 18 17 16 16   Temp: 97.7 F (36.5 C) 97.8 F (36.6 C) 98.5 F (36.9 C) 98.3 F (36.8 C)  TempSrc:  Oral Oral   SpO2: 99% 99% 98% 98%  Weight:      Height:         Body mass index is 21.56 kg/m.  Physical Exam   General: Laying comfortably in bed; in no acute distress.  HENT: Normal oropharynx and mucosa. Normal external appearance of ears and nose.  Neck: Supple, no pain or tenderness  CV: No JVD. No peripheral edema.  Pulmonary: Symmetric Chest rise. Normal respiratory effort.  Abdomen: Soft to touch, non-tender.  Ext: No cyanosis, edema, or deformity  Skin: No rash. Normal palpation of skin.   Musculoskeletal: Normal digits and nails by inspection. No clubbing.   Neurologic Examination  Mental status/Cognition: Alert, walking in the room. Speech/language: Fluent, comprehension intact. Cranial nerves:   CN II Pupils equal and round, no VF deficits    CN III,IV,VI EOM intact, no gaze preference or deviation, no nystagmus    CN V normal sensation in V1, V2, and V3 segments bilaterally    CN VII no asymmetry, no nasolabial fold flattening    CN VIII normal hearing to speech    CN IX &  X normal palatal elevation, no uvular deviation    CN XI 5/5 head turn and 5/5 shoulder shrug bilaterally    CN XII midline tongue protrusion    Motor:  Muscle bulk: poor Mvmt Root Nerve  Muscle Right Left Comments  SA C5/6 Ax Deltoid     EF C5/6 Mc Biceps 5 5   EE C6/7/8 Rad Triceps 4+ 5   WF C6/7 Med FCR     WE C7/8 PIN ECU     F Ab C8/T1 U ADM/FDI 5 5   HF L1/2/3 Fem Illopsoas     KE L2/3/4 Fem Quad     DF L4/5 D Peron Tib Ant     PF S1/2 Tibial Grc/Sol      Sensation:  Light touch Intact in BL hands   Pin prick    Temperature    Vibration   Proprioception    Coordination/Complex Motor:  - Finger to Nose intact BL  Labs   Basic Metabolic Panel:  Lab Results  Component Value Date   NA 140 08/29/2020   K 4.0 08/29/2020   CO2 24 08/29/2020   GLUCOSE 115 (H) 08/29/2020   BUN 17 08/29/2020   CREATININE 1.13 08/29/2020   CALCIUM 9.1 08/29/2020   GFRNONAA >60 08/29/2020   HbA1c:  Lab Results  Component Value Date   HGBA1C 5.4 08/30/2020   LDL:  Lab Results  Component Value Date   LDLCALC 89 08/30/2020   Urine Drug Screen:     Component Value Date/Time   LABOPIA NONE DETECTED 08/29/2020 2323   COCAINSCRNUR NONE DETECTED 08/29/2020 2323   LABBENZ NONE DETECTED 08/29/2020 2323   AMPHETMU NONE DETECTED 08/29/2020 2323   THCU NONE DETECTED 08/29/2020 2323   LABBARB NONE DETECTED 08/29/2020 2323    Alcohol Level No results found for: ETH No results found for: PHENYTOIN, ZONISAMIDE, LAMOTRIGINE, LEVETIRACETA No results found for: PHENYTOIN, PHENOBARB, VALPROATE, CBMZ  Imaging and Diagnostic studies  MRI HEAD IMPRESSION:  1. Early subacute ischemic infarct involving the posterior left parietal lobe, with involvement of the postcentral gyrus. Associated mild petechial hemorrhage without hemorrhagic transformation or significant mass effect. 2. Underlying age-related cerebral atrophy with moderate chronic microvascular ischemic disease, with multiple remote  infarcts about the basal ganglia, thalami, and cerebellum. 3. Multiple chronic micro hemorrhages clustered about the thalami, most consistent with chronic poorly controlled hypertension.  MRA HEAD IMPRESSION:  1. Negative intracranial MRA for large vessel occlusion. No hemodynamically significant or correctable stenosis. 2. Tiny 2 mm focal outpouching arising from the cavernous left ICA, which could reflect a small aneurysm versus vascular infundibulum. Attention at follow-up recommended.  US Carotid duplex: 1. Irregular echogenic plaque about the carotid bulb/proximal ICAs bilaterally, left greater than right, but without hemodynamically significant stenosis. Estimated stenosis 0-49% by ultrasound criteria. 2. Patent antegrade flow within both vertebral arteries within the Neck.  CT angio neck: 1. 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 The proximal right vertebral artery is obscured by streak artifact. 2. Mixed atherosclerosis at the carotid bifurcations with approximately 30% stenosis of bilateral proximal internal carotid arteries. 3. Small right carotid web. 4. Approximately 50% stenosis of the right cavernous ICA, incidentally imaged. 5. Emphysema.  Impression   Rodney James is a 75 y.o. male withPMH significant for HTN who presents with acute onset R hand weakness and numbness has significantly improved with now very mild RUE ataxia. 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 MR Angio head w/o contrast with no LVO. Tiny 9mm Aneurysm.  Recommendations  Plan:  - Frequent Neuro checks per stroke unit protocol - MRI Brain with early subacute posterior left parietal lobe, with involvement of the postcentral gyrus with associated mild petechial hemorrhage. - Vessel imaging with no LVO. - TTE is pending. - LDL of  89, on storvastatin 40mg  daily - HbA1c of 5.4. - Antithrombotic - Aspirin 81mg  daily. - Recommend DVT ppx - SBP goal - gradual normotension. - Recommend Telemetry monitoring for arrythmia - Recommend bedside swallow screen prior to PO intake. - Stroke education booklet - Recommend PT/OT/SLP consult - If TTE is negative, recommend implantable loop recorder placement. Discussed with patient and he agrees to it. - Follow up outpatient with Neurosurgery for the noted 64mm aneurysm in a few months. ______________________________________________________________________   Thank you for the opportunity to take part in the care of this patient. If you have any further questions, please contact the neurology consultation attending.  Signed,  [facility] Pager Number [phone]

## 2020-08-30 NOTE — Progress Notes (Signed)
*  PRELIMINARY RESULTS* Echocardiogram 2D Echocardiogram has been performed.  Rodney James 08/30/2020, 1:38 PM

## 2020-08-30 NOTE — Care Management Obs Status (Signed)
MEDICARE OBSERVATION STATUS NOTIFICATION   Patient Details  Name: Rodney James MRN: [id number] Date of Birth: 22-Dec-1945   Medicare Observation Status Notification Given:  Yes    Margarito Liner, LCSW 08/30/2020, 3:02 PM

## 2020-08-30 NOTE — Telephone Encounter (Signed)
-----   Message from Sondra Barges, PA-C sent at 08/30/2020 11:05 AM EDT ----- Good morning!  Patient needs follow up with EP within 2 weeks for consideration of loop. Being discharged with a Zio patch for CVA. Thanks!

## 2020-08-31 NOTE — Telephone Encounter (Signed)
There is not an  opening that I can see with Lalla Brothers with next 2 weeks.    Virgina Evener - is there somewhere we can work this patient in ?

## 2020-09-01 ENCOUNTER — Encounter: Payer: Medicare HMO | Admitting: Student

## 2020-09-01 NOTE — Telephone Encounter (Signed)
Per Message received from Harrietta Guardian, EP scheduler for [facility].   "Pt is scheduled for 4/26 at 930 am with Dr. Lalla Brothers in [place]. Pt is aware"

## 2020-09-13 ENCOUNTER — Encounter: Payer: Self-pay | Admitting: Cardiology

## 2020-09-13 ENCOUNTER — Ambulatory Visit (INDEPENDENT_AMBULATORY_CARE_PROVIDER_SITE_OTHER): Payer: Medicare HMO | Admitting: Cardiology

## 2020-09-13 ENCOUNTER — Other Ambulatory Visit: Payer: Self-pay

## 2020-09-13 VITALS — BP 130/66 | HR 75 | Ht 69.0 in | Wt 147.4 lb

## 2020-09-13 DIAGNOSIS — I10 Essential (primary) hypertension: Secondary | ICD-10-CM

## 2020-09-13 DIAGNOSIS — I639 Cerebral infarction, unspecified: Secondary | ICD-10-CM | POA: Diagnosis not present

## 2020-09-13 NOTE — Progress Notes (Signed)
Electrophysiology Office Note:    Date:  09/13/2020   ID:  Rodney James, DOB 12-13-1945, MRN [id number]  PCP:  Mick Sell, MD  [facility] Cardiologist:  No primary care provider on file.  [facility] Electrophysiologist:  Lanier Prude, MD   Referring MD: Mick Sell, MD   Chief Complaint: CVA  History of Present Illness:    Rodney James is a 75 y.o. male who presents for an evaluation of CVA at the request of Dr. Sampson Goon. Their medical history includes hypertension.  He was recently admitted to the hospital April 11 through August 30, 2020 with right arm numbness and weakness.  MRI of the brain during that admission showed an early subacute ischemic infarct involving the posterior left parietal lobe with involvement of the postcentral gyrus with mild petechial hemorrhage.  He was started on aspirin 81 mg by mouth once daily along with his statin.  He was discharged in the hospital with a event monitor to monitor for atrial fibrillation.  He is still wearing this at this time.  Today he tells me that his neurologic deficits have improved by 98%.  He still has a little bit of numbness in his right hand.  Past Medical History:  Diagnosis Date  . Hypertension     Past Surgical History:  Procedure Laterality Date  . APPENDECTOMY    . BACK SURGERY      Current Medications: Current Meds  Medication Sig  . amLODipine (NORVASC) 10 MG tablet Take 1 tablet by mouth daily.  Marland Kitchen aspirin EC 81 MG tablet Take 1 tablet (81 mg total) by mouth daily. Swallow whole.  Marland Kitchen atorvastatin (LIPITOR) 20 MG tablet Take 2 tablets (40 mg total) by mouth daily.     Allergies:   Patient has no known allergies.   Social History   Socioeconomic History  . Marital status: Single    Spouse name: Not on file  . Number of children: Not on file  . Years of education: Not on file  . Highest education level: Not on file  Occupational History  . Not on file  Tobacco Use  .  Smoking status: Former Smoker    Years: 30.00    Types: Cigarettes  . Smokeless tobacco: Never Used  Substance and Sexual Activity  . Alcohol use: Not Currently  . Drug use: Never  . Sexual activity: Not on file  Other Topics Concern  . Not on file  Social History Narrative  . Not on file   Social Determinants of Health   Financial Resource Strain: Not on file  Food Insecurity: Not on file  Transportation Needs: Not on file  Physical Activity: Not on file  Stress: Not on file  Social Connections: Not on file     Family History: The patient's family history includes Diabetes Mellitus II in his mother; Hypertension in his mother; Stomach cancer in his father; Stroke in his mother.  ROS:   Please see the history of present illness.    All other systems reviewed and are negative.  EKGs/Labs/Other Studies Reviewed:    The following studies were reviewed today:  August 30, 2020 echo personally reviewed Left ventricular function normal, 55% Right ventricular function normal Mild MR Moderate TR   EKG:  The ekg ordered today demonstrates sinus rhythm  Recent Labs: 08/29/2020: ALT 18; BUN 17; Creatinine, Ser 1.13; Hemoglobin 15.4; Platelets 179; Potassium 4.0; Sodium 140  Recent Lipid Panel    Component Value Date/Time   CHOL  139 08/30/2020 0543   TRIG 62 08/30/2020 0543   HDL 38 (L) 08/30/2020 0543   CHOLHDL 3.7 08/30/2020 0543   VLDL 12 08/30/2020 0543   LDLCALC 89 08/30/2020 0543    Physical Exam:    VS:  BP 130/66   Pulse 75   Ht 5\' 9"  (1.753 m)   Wt 147 lb 6.4 oz (66.9 kg)   SpO2 98%   BMI 21.77 kg/m     Wt Readings from Last 3 Encounters:  09/13/20 147 lb 6.4 oz (66.9 kg)  08/29/20 146 lb (66.2 kg)     GEN:  Well nourished, well developed in no acute distress HEENT: Normal NECK: No JVD; No carotid bruits LYMPHATICS: No lymphadenopathy CARDIAC: RRR, no murmurs, rubs, gallops RESPIRATORY:  Clear to auscultation without rales, wheezing or rhonchi   ABDOMEN: Soft, non-tender, non-distended MUSCULOSKELETAL:  No edema; No deformity  SKIN: Warm and dry NEUROLOGIC:  Alert and oriented x 3 PSYCHIATRIC:  Normal affect   ASSESSMENT:    1. Cerebrovascular accident (CVA), unspecified mechanism (HCC)   2. Primary hypertension    PLAN:    In order of problems listed above:  1. CVA Unclear etiology.  Suspect embolic given MRI findings.  Currently wearing a Holter monitor.  I do think it is important for 10/29/20 to monitor for any evidence of atrial fibrillation however I would like for the patient to finish wearing the Holter monitor before we make a final decision about implantation of a loop recorder.  If the Holter monitor does not show evidence of atrial fibrillation, would plan to implant a loop recorder.  I did discuss this procedure in detail the patient during today's visit including the risks and recovery time and he wishes to proceed pending the results of the Holter monitor.  We will plan to see him back in clinic in 4 weeks.  Would plan to implant the loop recorder at that visit if the results of the Holter show no evidence of atrial fibrillation.  In the meantime, he should continue taking his aspirin and statin.  2.  Hypertension Controlled at today's visit.  Continue amlodipine.  Medication Adjustments/Labs and Tests Ordered: Current medicines are reviewed at length with the patient today.  Concerns regarding medicines are outlined above.  Orders Placed This Encounter  Procedures  . EKG 12-Lead   No orders of the defined types were placed in this encounter.    Signed, [place]. Rossie Muskrat, MD, [facility], [facility] 09/13/2020 9:32 AM    Electrophysiology [facility]

## 2020-09-13 NOTE — Patient Instructions (Addendum)
Medication Instructions:  Your physician recommends that you continue on your current medications as directed. Please refer to the Current Medication list given to you today. *If you need a refill on your cardiac medications before your next appointment, please call your pharmacy*  Lab Work: None ordered. If you have labs (blood work) drawn today and your tests are completely normal, you will receive your results only by: Marland Kitchen MyChart Message (if you have MyChart) OR . A paper copy in the mail If you have any lab test that is abnormal or we need to change your treatment, we will call you to review the results.  Testing/Procedures: None ordered.  Follow-Up: At [facility], you and your health needs are our priority.  As part of our continuing mission to provide you with exceptional heart care, we have created designated Provider Care Teams.  These Care Teams include your primary Cardiologist (physician) and Advanced Practice Providers (APPs -  Physician Assistants and Nurse Practitioners) who all work together to provide you with the care you need, when you need it.  Your next appointment:   Your physician wants you to follow-up in: 3-4 weeks with Dr. Lalla Brothers.     Oct 12, 2020 at 10:40 am at the [facility] office for loop implant based on results of your heart monitor.

## 2020-09-20 NOTE — Addendum Note (Signed)
Encounter addended by: Oneida Arenas on: 09/20/2020 8:11 AM  Actions taken: Imaging Exam ended

## 2020-10-05 ENCOUNTER — Ambulatory Visit: Payer: Medicare HMO | Admitting: Cardiology

## 2020-10-12 ENCOUNTER — Other Ambulatory Visit: Payer: Self-pay

## 2020-10-12 ENCOUNTER — Ambulatory Visit (INDEPENDENT_AMBULATORY_CARE_PROVIDER_SITE_OTHER): Payer: 59 | Admitting: Cardiology

## 2020-10-12 ENCOUNTER — Encounter: Payer: Self-pay | Admitting: Cardiology

## 2020-10-12 VITALS — BP 140/70 | HR 64 | Ht 69.0 in | Wt 146.0 lb

## 2020-10-12 DIAGNOSIS — I639 Cerebral infarction, unspecified: Secondary | ICD-10-CM

## 2020-10-12 DIAGNOSIS — I10 Essential (primary) hypertension: Secondary | ICD-10-CM

## 2020-10-12 MED ORDER — ATORVASTATIN CALCIUM 40 MG PO TABS: 40.0000 mg | ORAL_TABLET | Freq: Every day | ORAL | 3 refills | Status: AC

## 2020-10-12 NOTE — Patient Instructions (Addendum)

## 2020-10-12 NOTE — Progress Notes (Signed)
Electrophysiology Office Follow up Visit Note:    Date:  10/12/2020   ID:  Rodney James, DOB 1945/06/02, MRN [id number]  PCP:  Mick Sell, MD  [facility] Cardiologist:  None  [facility] Electrophysiologist:  Lanier Prude, MD    Interval History:    Rodney James is a 75 y.o. male who presents for a follow up visit.  I last saw the patient September 13, 2020 for an evaluation of cryptogenic stroke.  At that appointment his neurologic deficits had improved significantly.  An embolic source of his stroke was suspected given the MRI findings.  He did wear a Holter which resulted on Sep 20, 2020 and showed no evidence of atrial fibrillation.   Since I saw the patient he has been doing well.  He ran out of his atorvastatin so he has not been taking the medication.     Past Medical History:  Diagnosis Date  . Hypertension     Past Surgical History:  Procedure Laterality Date  . APPENDECTOMY    . BACK SURGERY      Current Medications: Current Meds  Medication Sig  . amLODipine (NORVASC) 10 MG tablet Take 1 tablet by mouth daily.  Marland Kitchen aspirin EC 81 MG tablet Take 1 tablet (81 mg total) by mouth daily. Swallow whole.  Marland Kitchen atorvastatin (LIPITOR) 40 MG tablet Take 1 tablet (40 mg total) by mouth daily.     Allergies:   Patient has no known allergies.   Social History   Socioeconomic History  . Marital status: Single    Spouse name: Not on file  . Number of children: Not on file  . Years of education: Not on file  . Highest education level: Not on file  Occupational History  . Not on file  Tobacco Use  . Smoking status: Former Smoker    Years: 30.00    Types: Cigarettes  . Smokeless tobacco: Never Used  Substance and Sexual Activity  . Alcohol use: Not Currently  . Drug use: Never  . Sexual activity: Not on file  Other Topics Concern  . Not on file  Social History Narrative  . Not on file   Social Determinants of Health   Financial Resource Strain:  Not on file  Food Insecurity: Not on file  Transportation Needs: Not on file  Physical Activity: Not on file  Stress: Not on file  Social Connections: Not on file     Family History: The patient's family history includes Diabetes Mellitus II in his mother; Hypertension in his mother; Stomach cancer in his father; Stroke in his mother.  ROS:   Please see the history of present illness.    All other systems reviewed and are negative.  EKGs/Labs/Other Studies Reviewed:    The following studies were reviewed today:  Sep 20, 2020 ZIO personally reviewed HR 54 - 150 bpm, average 76 bpm. 1 episode of SVT, longest lasting 5 beats. Review of rhythm strip suggests AT. Rare supraventricular and ventricular ectopy. No sustained arrhythmias.    EKG:  The ekg ordered today demonstrates sinus rhythm.  Recent Labs: 08/29/2020: ALT 18; BUN 17; Creatinine, Ser 1.13; Hemoglobin 15.4; Platelets 179; Potassium 4.0; Sodium 140  Recent Lipid Panel    Component Value Date/Time   CHOL 139 08/30/2020 0543   TRIG 62 08/30/2020 0543   HDL 38 (L) 08/30/2020 0543   CHOLHDL 3.7 08/30/2020 0543   VLDL 12 08/30/2020 0543   LDLCALC 89 08/30/2020 0543    Physical  Exam:    VS:  BP 140/70 (BP Location: Left Arm, Patient Position: Sitting, Cuff Size: Normal)   Pulse 64   Ht 5\' 9"  (1.753 m)   Wt 146 lb (66.2 kg)   SpO2 98%   BMI 21.56 kg/m     Wt Readings from Last 3 Encounters:  10/12/20 146 lb (66.2 kg)  09/13/20 147 lb 6.4 oz (66.9 kg)  08/29/20 146 lb (66.2 kg)     GEN:  Well nourished, well developed in no acute distress.  Appears younger than stated age HEENT: Normal NECK: No JVD; No carotid bruits LYMPHATICS: No lymphadenopathy CARDIAC: RRR, no murmurs, rubs, gallops RESPIRATORY:  Clear to auscultation without rales, wheezing or rhonchi  ABDOMEN: Soft, non-tender, non-distended MUSCULOSKELETAL:  No edema; No deformity  SKIN: Warm and dry NEUROLOGIC:  Alert and oriented x  3 PSYCHIATRIC:  Normal affect   ASSESSMENT:    1. Cerebrovascular accident (CVA), unspecified mechanism (HCC)   2. Primary hypertension    PLAN:    In order of problems listed above:  1. Cryptogenic stroke No evidence of atrial fibrillation on monitoring.  Today's EKG shows sinus rhythm.  I discussed the loop recorder implant with the patient again during today's visit and he is not interested in proceeding with loop implant.  I would recommend continued aggressive risk factor modification.  I do think he should be on atorvastatin.  I have sent refills in for him to cover the next year.  He should have close follow-up with his primary care physician for monitoring of his blood pressures and other risk factors. Continue aspirin.  If patient changes his mind and would like loop recorder implanted, we will be happy to get him in to our clinic.  2.  Hypertension Controlled.  Continue amlodipine 10 mg daily. Goal BP less than 140 mmHg. Close follow-up with primary care physician.  Follow-up as needed.     Medication Adjustments/Labs and Tests Ordered: Current medicines are reviewed at length with the patient today.  Concerns regarding medicines are outlined above.  Orders Placed This Encounter  Procedures  . EKG 12-Lead   Meds ordered this encounter  Medications  . atorvastatin (LIPITOR) 40 MG tablet    Sig: Take 1 tablet (40 mg total) by mouth daily.    Dispense:  90 tablet    Refill:  3     Signed, 10/29/20, MD, [facility], [facility] 10/12/2020 10:49 AM    Electrophysiology [facility]

## 2021-05-08 ENCOUNTER — Other Ambulatory Visit: Payer: Self-pay

## 2021-05-08 ENCOUNTER — Ambulatory Visit (INDEPENDENT_AMBULATORY_CARE_PROVIDER_SITE_OTHER): Payer: 59 | Admitting: Urology

## 2021-05-08 ENCOUNTER — Encounter: Payer: Self-pay | Admitting: Urology

## 2021-05-08 VITALS — BP 130/71 | HR 87 | Ht 69.0 in | Wt 146.0 lb

## 2021-05-08 DIAGNOSIS — N138 Other obstructive and reflux uropathy: Secondary | ICD-10-CM

## 2021-05-08 DIAGNOSIS — N401 Enlarged prostate with lower urinary tract symptoms: Secondary | ICD-10-CM | POA: Diagnosis not present

## 2021-05-08 DIAGNOSIS — Z125 Encounter for screening for malignant neoplasm of prostate: Secondary | ICD-10-CM

## 2021-05-08 NOTE — Progress Notes (Signed)
° °  05/08/21 3:00 PM   Rodney James 09/19/1945 [id number]  CC: PSA screening, BPH  HPI: 75 year old male with history of BPH and urinary retention after a neck surgery, followed by TURP at that time about 3 years ago.  Pathology from TURP was reportedly benign.  He has been voiding spontaneously since that time with a good stream and denies any urinary complaints or gross hematuria.  This was all performed in [place], and those records are not available to me.  PCP checked PSA and July and December 2022, and levels were 5.05 and 5.31.  He denies any family history of prostate cancer.   PMH: Past Medical History:  Diagnosis Date   Hypertension     Surgical History: Past Surgical History:  Procedure Laterality Date   APPENDECTOMY     BACK SURGERY        Family History: Family History  Problem Relation Age of Onset   Stroke Mother    Diabetes Mellitus II Mother    Hypertension Mother    Stomach cancer Father     Social History:  reports that he has quit smoking. His smoking use included cigarettes. He has never used smokeless tobacco. He reports that he does not currently use alcohol. He reports that he does not use drugs.  Physical Exam: BP 130/71    Pulse 87    Ht 5\' 9"  (1.753 m)    Wt 146 lb (66.2 kg)    BMI 21.56 kg/m    Constitutional:  Alert and oriented, No acute distress. Cardiovascular: No clubbing, cyanosis, or edema. Respiratory: Normal respiratory effort, no increased work of breathing. GI: Abdomen is soft, nontender, nondistended, no abdominal masses DRE: 75 g, smooth, no nodules or masses  Laboratory Data: Reviewed, see HPI  Pertinent Imaging: None to review  Assessment & Plan:   75 year old male with history of urinary retention and BPH with TURP about 3 years ago who has been voiding spontaneously since that time.  Recent PSA was 5 and on repeat was 5.3, which is actually within the normal range for his age (<6.5 for men over 49).  DRE today is  benign.  We reviewed the AUA guidelines that do not recommend routine screening in men over age 49, and with his age and comorbidities I did not recommend further evaluation or repeat PSA screening.  We discussed the very low, but nonzero, risk of missing a clinically significant prostate cancer by discontinuing screening.  He agrees to discontinue screening per AUA guidelines.  PSA normal for age, discontinue screening per guideline recommendations Follow-up with urology as needed   64, MD 05/08/2021  [facility] [address], [address] [place], [place] [phone]

## 2021-05-08 NOTE — Patient Instructions (Signed)
Prostate Cancer Screening ?Prostate cancer screening is testing that is done to check for the presence of prostate cancer in men. The prostate gland is a walnut-sized gland that is located below the bladder and in front of the rectum in males. The function of the prostate is to add fluid to semen during ejaculation. Prostate cancer is one of the most common types of cancer in men. ?Who should have prostate cancer screening? ?Screening recommendations vary based on age and other risk factors, as well as between the professional organizations who make the recommendations. ?In general, screening is recommended if: ?You are age 50 to 70 and have an average risk for prostate cancer. You should talk with your health care provider about your need for screening and how often screening should be done. Because most prostate cancers are slow growing and will not cause death, screening in this age group is generally reserved for men who have a 10- to 15-year life expectancy. ?You are younger than age 50, and you have these risk factors: ?Having a father, brother, or uncle who has been diagnosed with prostate cancer. The risk is higher if your family member's cancer occurred at an early age or if you have multiple family members with prostate cancer at an early age. ?Being a male who is Black or is of Caribbean or sub-Saharan African descent. ?In general, screening is not recommended if: ?You are younger than age 40. ?You are between the ages of 40 and 49 and you have no risk factors. ?You are 70 years of age or older. At this age, the risks that screening can cause are greater than the benefits that it may provide. ?If you are at high risk for prostate cancer, your health care provider may recommend that you have screenings more often or that you start screening at a younger age. ?How is screening for prostate cancer done? ?The recommended prostate cancer screening test is a blood test called the prostate-specific antigen (PSA)  test. PSA is a protein that is made in the prostate. As you age, your prostate naturally produces more PSA. Abnormally high PSA levels may be caused by: ?Prostate cancer. ?An enlarged prostate that is not caused by cancer (benign prostatic hyperplasia, or BPH). This condition is very common in older men. ?A prostate gland infection (prostatitis) or urinary tract infection. ?Certain medicines such as male hormones (like testosterone) or other medicines that raise testosterone levels. ?A rectal exam may be done as part of prostate cancer screening to help provide information about the size of your prostate gland. When a rectal exam is performed, it should be done after the PSA level is drawn to avoid any effect on the results. ?Depending on the PSA results, you may need more tests, such as: ?A physical exam to check the size of your prostate gland, if not done as part of screening. ?Blood and imaging tests. ?A procedure to remove tissue samples from your prostate gland for testing (biopsy). This is the only way to know for certain if you have prostate cancer. ?What are the benefits of prostate cancer screening? ?Screening can help to identify cancer at an early stage, before symptoms start and when the cancer can be treated more easily. ?There is a small chance that screening may lower your risk of dying from prostate cancer. The chance is small because prostate cancer is a slow-growing cancer, and most men with prostate cancer die from a different cause. ?What are the risks of prostate cancer screening? ?The main   risk of prostate cancer screening is diagnosing and treating prostate cancer that would never have caused any symptoms or problems. This is called overdiagnosisand overtreatment. PSA screening cannot tell you if your PSA is high due to cancer or a different cause. A prostate biopsy is the only procedure to diagnose prostate cancer. Even the results of a biopsy may not tell you if your cancer needs to be  treated. Slow-growing prostate cancer may not need any treatment other than monitoring, so diagnosing and treating it may cause unnecessary stress or other side effects. ?Questions to ask your health care provider ?When should I start prostate cancer screening? ?What is my risk for prostate cancer? ?How often do I need screening? ?What type of screening tests do I need? ?How do I get my test results? ?What do my results mean? ?Do I need treatment? ?Where to find more information ?The American Cancer Society: www.cancer.org ?American Urological Association: www.auanet.org ?Contact a health care provider if: ?You have difficulty urinating. ?You have pain when you urinate or ejaculate. ?You have blood in your urine or semen. ?You have pain in your back or in the area of your prostate. ?Summary ?Prostate cancer is a common type of cancer in men. The prostate gland is located below the bladder and in front of the rectum. This gland adds fluid to semen during ejaculation. ?Prostate cancer screening may identify cancer at an early stage, when the cancer can be treated more easily and is less likely to have spread to other areas of the body. ?The prostate-specific antigen (PSA) test is the recommended screening test for prostate cancer, but it has associated risks. ?Discuss the risks and benefits of prostate cancer screening with your health care provider. If you are age 70 or older, the risks that screening can cause are greater than the benefits that it may provide. ?This information is not intended to replace advice given to you by your health care provider. Make sure you discuss any questions you have with your health care provider. ?Document Revised: 10/31/2020 Document Reviewed: 10/31/2020 ?Elsevier Patient Education ? 2022 Elsevier Inc. ? ?

## 2021-06-01 ENCOUNTER — Other Ambulatory Visit: Payer: Self-pay | Admitting: Physician Assistant

## 2021-06-01 DIAGNOSIS — I671 Cerebral aneurysm, nonruptured: Secondary | ICD-10-CM

## 2021-06-10 ENCOUNTER — Other Ambulatory Visit: Payer: Self-pay

## 2021-06-10 ENCOUNTER — Ambulatory Visit
Admission: RE | Admit: 2021-06-10 | Discharge: 2021-06-10 | Disposition: A | Discharge status: Home / Self Care | Payer: 59 | Source: Ambulatory Visit | Attending: Physician Assistant | Admitting: Physician Assistant

## 2021-06-10 DIAGNOSIS — I671 Cerebral aneurysm, nonruptured: Secondary | ICD-10-CM | POA: Diagnosis present

## 2022-06-06 ENCOUNTER — Other Ambulatory Visit: Payer: Self-pay | Admitting: Physician Assistant

## 2022-06-06 DIAGNOSIS — I671 Cerebral aneurysm, nonruptured: Secondary | ICD-10-CM

## 2022-12-29 ENCOUNTER — Other Ambulatory Visit: Payer: Self-pay

## 2022-12-29 ENCOUNTER — Emergency Department: Payer: 59

## 2022-12-29 ENCOUNTER — Emergency Department
Admission: EM | Admit: 2022-12-29 | Discharge: 2022-12-29 | Disposition: A | Discharge status: Home / Self Care | Payer: 59 | Attending: Emergency Medicine | Admitting: Emergency Medicine

## 2022-12-29 DIAGNOSIS — R55 Syncope and collapse: Secondary | ICD-10-CM | POA: Insufficient documentation

## 2022-12-29 DIAGNOSIS — E860 Dehydration: Secondary | ICD-10-CM | POA: Diagnosis not present

## 2022-12-29 LAB — CBC
HCT: 40.1 % (ref 39.0–52.0)
Hemoglobin: 12.9 g/dL — ABNORMAL LOW (ref 13.0–17.0)
MCH: 33.4 pg (ref 26.0–34.0)
MCHC: 32.2 g/dL (ref 30.0–36.0)
MCV: 103.9 fL — ABNORMAL HIGH (ref 80.0–100.0)
Platelets: 142 10*3/uL — ABNORMAL LOW (ref 150–400)
RBC: 3.86 MIL/uL — ABNORMAL LOW (ref 4.22–5.81)
RDW: 12.4 % (ref 11.5–15.5)
WBC: 6.7 10*3/uL (ref 4.0–10.5)
nRBC: 0 % (ref 0.0–0.2)

## 2022-12-29 LAB — BASIC METABOLIC PANEL
Anion gap: 7 (ref 5–15)
BUN: 19 mg/dL (ref 8–23)
CO2: 23 mmol/L (ref 22–32)
Calcium: 8.2 mg/dL — ABNORMAL LOW (ref 8.9–10.3)
Chloride: 110 mmol/L (ref 98–111)
Creatinine, Ser: 1.56 mg/dL — ABNORMAL HIGH (ref 0.61–1.24)
GFR, Estimated: 45 mL/min — ABNORMAL LOW (ref >60)
Glucose, Bld: 114 mg/dL — ABNORMAL HIGH (ref 70–99)
Potassium: 3.6 mmol/L (ref 3.5–5.1)
Sodium: 140 mmol/L (ref 135–145)

## 2022-12-29 LAB — TROPONIN I (HIGH SENSITIVITY): Troponin I (High Sensitivity): 10 ng/L (ref <18)

## 2022-12-29 MED ORDER — SODIUM CHLORIDE 0.9 % IV BOLUS
1000.0000 mL | Freq: Once | INTRAVENOUS | Status: AC
  Administered 2022-12-29: 1000 mL via INTRAVENOUS

## 2022-12-29 NOTE — ED Provider Notes (Signed)
Care of this patient assumed from prior physician at 1500 pending imaging, reevaluation, and disposition. Please see prior physician note for further details.  77 year old male who presented after syncopal episode with prodrome in the setting of poor p.o. intake.  Did have a head strike.  Initially hypotensive with EMS, blood pressure improved here.  Did have signs of dehydration on initial exam.  Given IV fluids.  Lab work was obtained .  Mild anemia from prior noted, patient denies any acute bleeding such as rectal bleeding, hematuria, nosebleeds.  Normal troponin.  CMP demonstrated mild AKI, possibly related to dehydration.  EKG without acute ischemic findings.  Signed out to me pending imaging of head, C-spine, max face, x- of the hand.  These resulted without acute traumatic injuries.  Patient was reevaluated.  He reports feeling much improved.  Denies any ongoing lightheadedness.  He is eager to be discharged.  He does have a primary care doctor who can follow-up with for follow-up labs regarding his anemia and kidney injury.  He did have a urinalysis initially ordered, but patient denies urinary symptoms and would like to hold off on testing which I do think is reasonable.  Family comfortable with discharge home.  Strict return precautions provided.  Patient discharged in stable condition.   Trinna Post, MD 12/30/22 [phone]

## 2022-12-29 NOTE — Discharge Instructions (Addendum)
You were seen in the emergency department today for evaluation after passing out.  Make sure you are staying hydrated at home.  Please follow with your primary care doctor for further evaluation and repeat labs.  Return to the ER for new or worsening symptoms.

## 2022-12-29 NOTE — ED Provider Notes (Signed)
[facility] Provider Note    Event Date/Time   First MD Initiated Contact with Patient 12/29/22 1335     (approximate)   History   Loss of Consciousness   HPI  Rodney James is a 77 y.o. male  here with LOC. Pt had not eaten anything today. He was visiting family then stood up to walk outside. He began to feel lightheaded and weak, and like he needed to "get to the crackers" in his car. He then swayed and fell, landing on his face. Brief LOC per family that was there. He then came back to and said he was okay but was weak, clammy. BP 70s systolic per EMS, improved now with fluid. H/o similar episodes when he didn't eat in the past. No recent BP med changes. No other complaints. No CP, SOB.      Physical Exam   Triage Vital Signs: ED Triage Vitals  Encounter Vitals Group     BP 12/29/22 1346 (!) 156/61     Systolic BP Percentile --      Diastolic BP Percentile --      Pulse Rate 12/29/22 1345 66     Resp 12/29/22 1345 19     Temp 12/29/22 1346 97.7 F (36.5 C)     Temp Source 12/29/22 1346 Oral     SpO2 12/29/22 1345 94 %     Weight 12/29/22 1349 146 lb (66.2 kg)     Height 12/29/22 1349 5\' 9"  (1.753 m)     Head Circumference --      Peak Flow --      Pain Score 12/29/22 1348 0     Pain Loc --      Pain Education --      Exclude from Growth Chart --     Most recent vital signs: Vitals:   12/29/22 1400 12/29/22 1623  BP: (!) 153/67 (!) 223/98  Pulse: 63 (!) 104  Resp: 14 18  Temp:  97.7 F (36.5 C)  SpO2: 97% 97%     General: Awake, no distress.  CV:  Good peripheral perfusion. RRR. Resp:  Normal work of breathing. Lungs CTAB. Abd:  No distention. No tenderness. Other:  Mildly dry MM. Bruising/abrasions to R upper lip and maxilla, small subconj hemorrhage R eye but PERRL, globes intact and EOMI. No proptosis. CNII-XII intact. Strength 5/5 bl UE and LE. Normal sensation to light touch.   ED Results / Procedures / Treatments    Labs (all labs ordered are listed, but only abnormal results are displayed) Labs Reviewed  BASIC METABOLIC PANEL - Abnormal; Notable for the following components:      Result Value   Glucose, Bld 114 (*)    Creatinine, Ser 1.56 (*)    Calcium 8.2 (*)    GFR, Estimated 45 (*)    All other components within normal limits  CBC - Abnormal; Notable for the following components:   RBC 3.86 (*)    Hemoglobin 12.9 (*)    MCV 103.9 (*)    Platelets 142 (*)    All other components within normal limits  TROPONIN I (HIGH SENSITIVITY)     EKG Normal sinus rhythm, VR 67. PR 172, QRS 88,. QTc 446. No acute ST elevations or depressions. No ischemia or infarct.   RADIOLOGY Pending   I also independently reviewed and agree with radiologist interpretations.   PROCEDURES:  Critical Care performed: No  .1-3 Lead EKG Interpretation  Performed by: Shaune Pollack,  MD Authorized by: Shaune Pollack, MD     Interpretation: normal     ECG rate:  90-100   ECG rate assessment: normal     Rhythm: sinus rhythm     Ectopy: none     Conduction: normal   Comments:     Indication: Syncope     MEDICATIONS ORDERED IN ED: Medications  sodium chloride 0.9 % bolus 1,000 mL (0 mLs Intravenous Stopped 12/29/22 1531)     IMPRESSION / MDM / ASSESSMENT AND PLAN / ED COURSE  I reviewed the triage vital signs and the nursing notes.                              Differential diagnosis includes, but is not limited to, orthostatic syncope, arrhythmia, vasovagal syncope, anemia, ACS, dehydration  Patient's presentation is most consistent with acute presentation with potential threat to life or bodily function.  The patient is on the cardiac monitor to evaluate for evidence of arrhythmia and/or significant heart rate changes  77 yo M here with suspected orthostatic syncope in setting of poor PO intake. EKG nonischemic, no arrhytmias noted. EKG is nonischemic and trop negative, do not suspect ACS.  He had no focal neuro deficits, no seizure activity, and low concern for seizure or CVA. CBC shows mild anemia - can w/u as outpt. No blood in stools reported. BMP c/w mild dehydration. Will give fluids, f/u imaging and dispo.     FINAL CLINICAL IMPRESSION(S) / ED DIAGNOSES   Final diagnoses:  Dehydration  Syncope and collapse     Rx / DC Orders   ED Discharge Orders     None        Note:  This document was prepared using Dragon voice recognition software and may include unintentional dictation errors.   Shaune Pollack, MD 12/29/22 [phone]
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
## 2022-12-29 NOTE — ED Notes (Signed)
Pt ambulated to bathroom to urinate, tolerated well.
